# Patient Record
Sex: Male | Born: 1985 | Race: White | Hispanic: No | Marital: Married | Smoking: Never smoker
Health system: Southern US, Community
[De-identification: ages and names within clinical notes are randomized; demographics above are authoritative.]

## PROBLEM LIST (undated history)

## (undated) DIAGNOSIS — J45909 Unspecified asthma, uncomplicated: Secondary | ICD-10-CM

## (undated) HISTORY — PX: TONSILLECTOMY: SUR1361

## (undated) HISTORY — DX: Unspecified asthma, uncomplicated: J45.909

---
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
## 2011-01-30 ENCOUNTER — Emergency Department: Payer: Self-pay | Admitting: Emergency Medicine

## 2012-03-16 ENCOUNTER — Emergency Department: Payer: Self-pay | Admitting: Emergency Medicine

## 2013-01-10 ENCOUNTER — Emergency Department: Payer: Self-pay | Admitting: Emergency Medicine

## 2013-01-19 ENCOUNTER — Emergency Department: Payer: Self-pay | Admitting: Emergency Medicine

## 2014-01-03 IMAGING — CR DG WRIST COMPLETE 3+V*R*
1 series · 4 of 4 positions shown · non-contrast
Comparison: none

REASON FOR EXAM: wrist pain
COMMENTS:

[Series 1: x wrist pa right · 0.14mm/px · 4 of 4 slices shown]
[im 1/4]
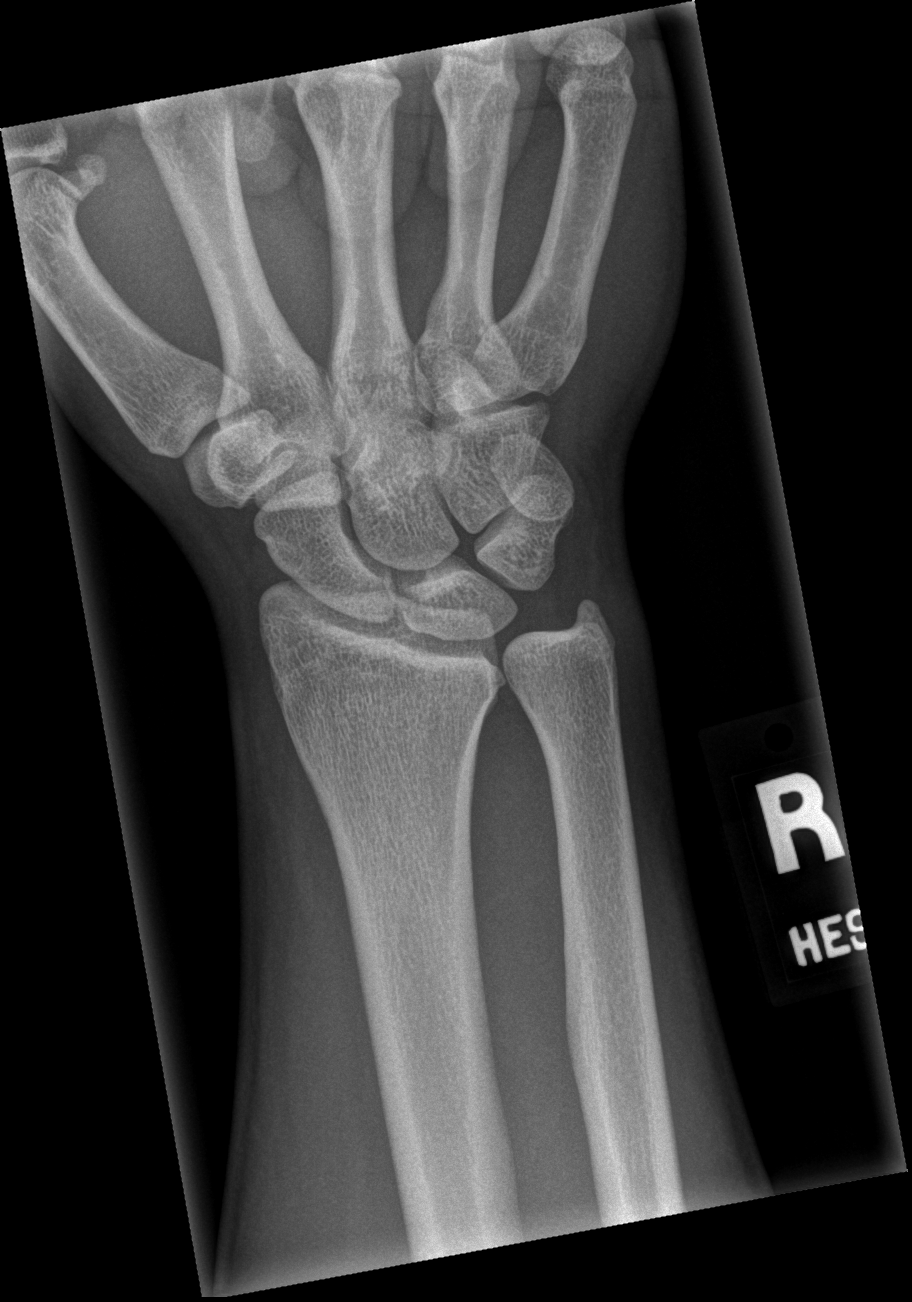
[im 2/4]
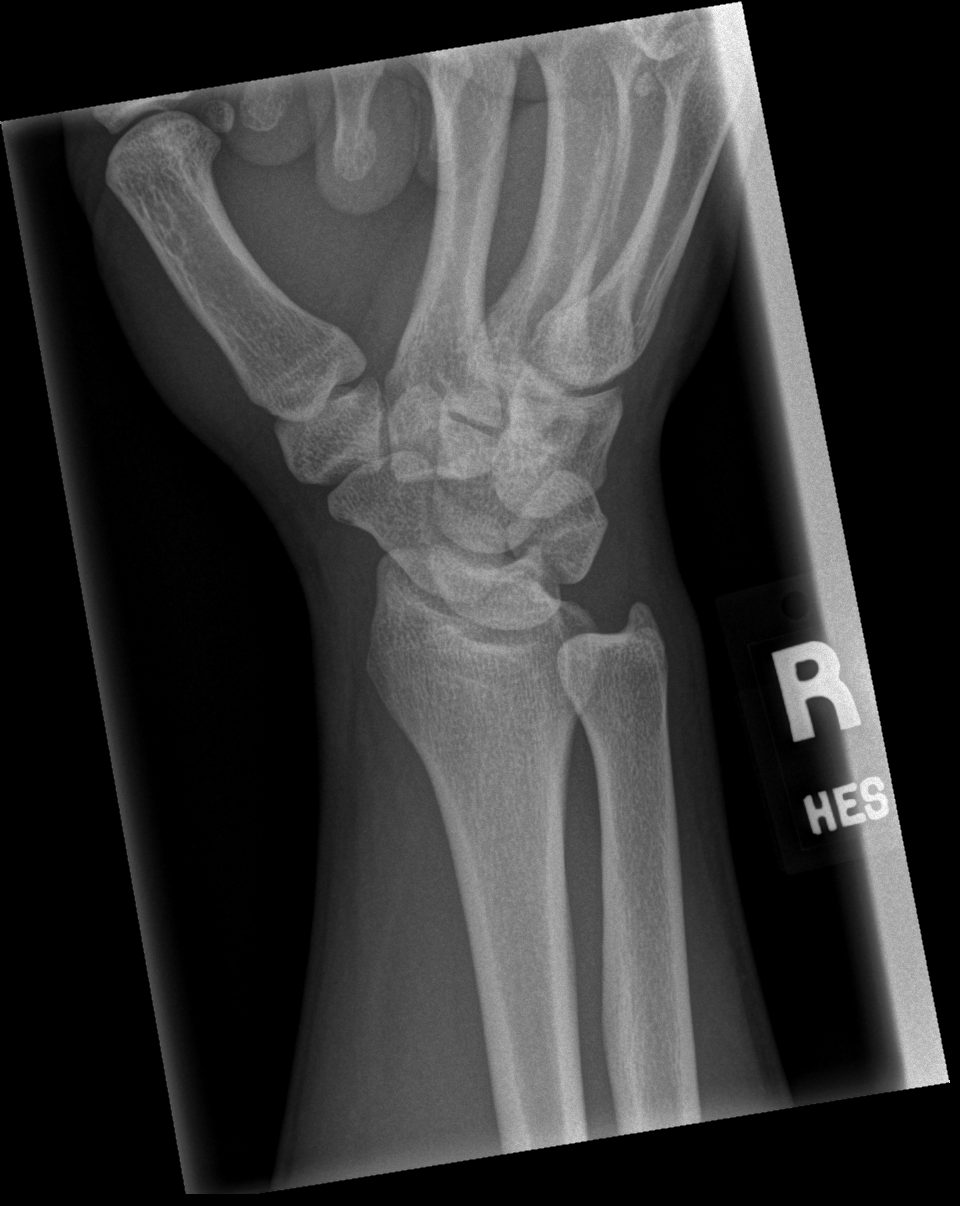
[im 3/4]
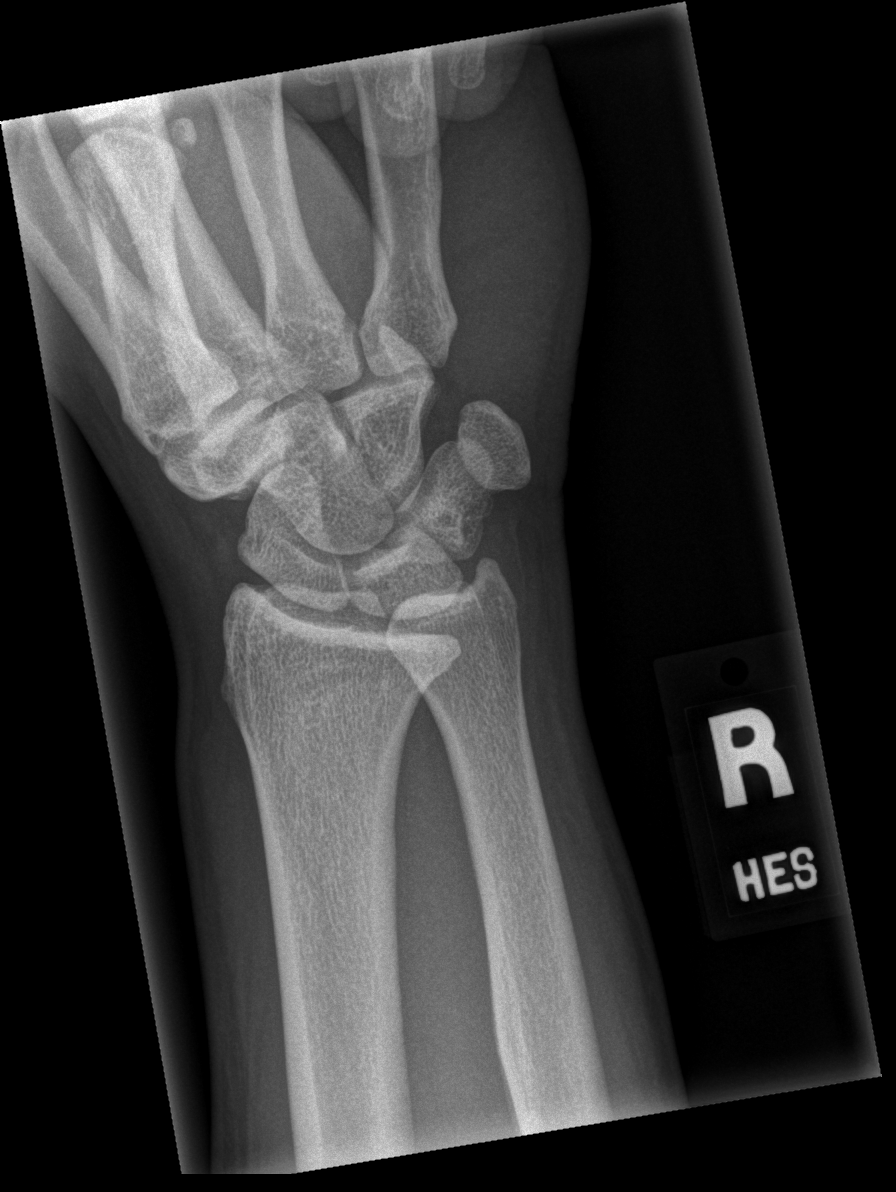
[im 4/4]
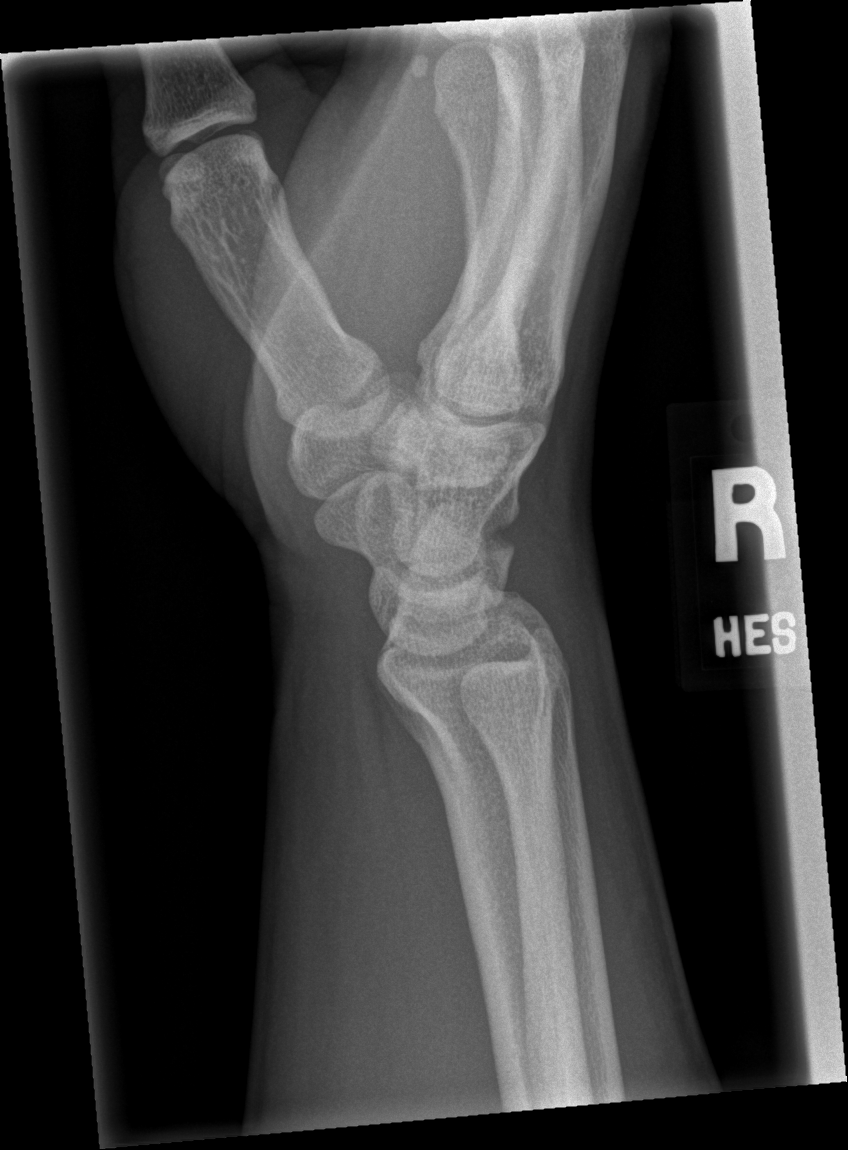

[4 of 4 positions shown; findings below may reference images not displayed]

PROCEDURE:     DXR - DXR WRIST RT COMP WITH OBLIQUES  - March 16, 2012  [DATE]

RESULT:     Four views of the right wrist reveal the bones to be adequately
mineralized. There is no evidence of acute fracture. The overlying soft
tissues exhibit no acute abnormality. No significant degenerative change is
demonstrated.
IMPRESSION: There is no acute bony abnormality of the right wrist.
Given the patient's symptoms, elective followup MRI may be useful.

[REDACTED]

## 2016-02-22 ENCOUNTER — Encounter: Payer: Self-pay | Admitting: Nurse Practitioner

## 2017-06-27 DIAGNOSIS — J309 Allergic rhinitis, unspecified: Secondary | ICD-10-CM | POA: Diagnosis not present

## 2017-06-27 DIAGNOSIS — J4540 Moderate persistent asthma, uncomplicated: Secondary | ICD-10-CM | POA: Diagnosis not present

## 2018-10-29 ENCOUNTER — Encounter: Payer: Self-pay | Admitting: Adult Health

## 2018-10-29 ENCOUNTER — Ambulatory Visit (INDEPENDENT_AMBULATORY_CARE_PROVIDER_SITE_OTHER): Payer: Commercial Managed Care - PPO | Admitting: Adult Health

## 2018-10-29 ENCOUNTER — Other Ambulatory Visit: Payer: Self-pay

## 2018-10-29 VITALS — BP 113/70 | HR 78 | Temp 98.3°F | Ht 65.75 in | Wt 192.3 lb

## 2018-10-29 DIAGNOSIS — Z114 Encounter for screening for human immunodeficiency virus [HIV]: Secondary | ICD-10-CM

## 2018-10-29 DIAGNOSIS — J4540 Moderate persistent asthma, uncomplicated: Secondary | ICD-10-CM | POA: Diagnosis not present

## 2018-10-29 DIAGNOSIS — Z0000 Encounter for general adult medical examination without abnormal findings: Secondary | ICD-10-CM | POA: Diagnosis not present

## 2018-10-29 MED ORDER — FLUTICASONE PROPIONATE 50 MCG/ACT NA SUSP: 2.0000 | Freq: Every day | NASAL | 2 refills | Status: DC

## 2018-10-29 MED ORDER — ALBUTEROL SULFATE HFA 108 (90 BASE) MCG/ACT IN AERS: 6.0000 | INHALATION_SPRAY | Freq: Four times a day (QID) | RESPIRATORY_TRACT | 6 refills | Status: DC | PRN

## 2018-10-29 MED ORDER — ADVAIR DISKUS 500-50 MCG/DOSE IN AEPB: 1.0000 | INHALATION_SPRAY | Freq: Every day | RESPIRATORY_TRACT | 6 refills | Status: DC | PRN

## 2018-10-29 NOTE — Assessment & Plan Note (Signed)
Last PFT in early childhood Has never been evaluated by Pulmonologist  Reports sx's well controlled- currently on Advair 500-50 mcg QD, Ventolin HFA PRN He has never used tobacco/vape products

## 2018-10-29 NOTE — Patient Instructions (Signed)
Mediterranean Diet A Mediterranean diet refers to food and lifestyle choices that are based on the traditions of countries located on the [employer]. This way of eating has been shown to help prevent certain conditions and improve outcomes for people who have chronic diseases, like kidney disease and heart disease. What are tips for following this plan? Lifestyle  Cook and eat meals together with your family, when possible.  Drink enough fluid to keep your urine clear or pale yellow.  Be physically active every day. This includes: ? Aerobic exercise like running or swimming. ? Leisure activities like gardening, walking, or housework.  Get 7-8 hours of sleep each night.  If recommended by your health care provider, drink red wine in moderation. This means 1 glass a day for nonpregnant women and 2 glasses a day for men. A glass of wine equals 5 oz (150 mL). Reading food labels   Check the serving size of packaged foods. For foods such as rice and pasta, the serving size refers to the amount of cooked product, not dry.  Check the total fat in packaged foods. Avoid foods that have saturated fat or trans fats.  Check the ingredients list for added sugars, such as corn syrup. Shopping  At the grocery store, buy most of your food from the areas near the walls of the store. This includes: ? Fresh fruits and vegetables (produce). ? Grains, beans, nuts, and seeds. Some of these may be available in unpackaged forms or large amounts (in bulk). ? Fresh seafood. ? Poultry and eggs. ? Low-fat dairy products.  Buy whole ingredients instead of prepackaged foods.  Buy fresh fruits and vegetables in-season from local farmers markets.  Buy frozen fruits and vegetables in resealable bags.  If you do not have access to quality fresh seafood, buy precooked frozen shrimp or canned fish, such as tuna, salmon, or sardines.  Buy small amounts of raw or cooked vegetables, salads, or olives from  the deli or salad bar at your store.  Stock your pantry so you always have certain foods on hand, such as olive oil, canned tuna, canned tomatoes, rice, pasta, and beans. Cooking  Cook foods with extra-virgin olive oil instead of using butter or other vegetable oils.  Have meat as a side dish, and have vegetables or grains as your main dish. This means having meat in small portions or adding small amounts of meat to foods like pasta or stew.  Use beans or vegetables instead of meat in common dishes like chili or lasagna.  Experiment with different cooking methods. Try roasting or broiling vegetables instead of steaming or sauteing them.  Add frozen vegetables to soups, stews, pasta, or rice.  Add nuts or seeds for added healthy fat at each meal. You can add these to yogurt, salads, or vegetable dishes.  Marinate fish or vegetables using olive oil, lemon juice, garlic, and fresh herbs. Meal planning   Plan to eat 1 vegetarian meal one day each week. Try to work up to 2 vegetarian meals, if possible.  Eat seafood 2 or more times a week.  Have healthy snacks readily available, such as: ? Vegetable sticks with hummus. ? [place] yogurt. ? Fruit and nut trail mix.  Eat balanced meals throughout the week. This includes: ? Fruit: 2-3 servings a day ? Vegetables: 4-5 servings a day ? Low-fat dairy: 2 servings a day ? Fish, poultry, or lean meat: 1 serving a day ? Beans and legumes: 2 or more servings a week ?  Nuts and seeds: 1-2 servings a day ? Whole grains: 6-8 servings a day ? Extra-virgin olive oil: 3-4 servings a day  Limit red meat and sweets to only a few servings a month What are my food choices?  Mediterranean diet ? Recommended ? Grains: Whole-grain pasta. Brown rice. Bulgar wheat. Polenta. Couscous. Whole-wheat bread. Orpah Cobb. ? Vegetables: Artichokes. Beets. Broccoli. Cabbage. Carrots. Eggplant. Green beans. Chard. Kale. Spinach. Onions. Leeks. Peas. Squash.  Tomatoes. Peppers. Radishes. ? Fruits: Apples. Apricots. Avocado. Berries. Bananas. Cherries. Dates. Figs. Grapes. Lemons. Melon. Oranges. Peaches. Plums. Pomegranate. ? Meats and other protein foods: Beans. Almonds. Sunflower seeds. Pine nuts. Peanuts. Cod. Salmon. Scallops. Shrimp. Tuna. Tilapia. Clams. Oysters. Eggs. ? Dairy: Low-fat milk. Cheese. Greek yogurt. ? Beverages: Water. Red wine. Herbal tea. ? Fats and oils: Extra virgin olive oil. Avocado oil. Grape seed oil. ? Sweets and desserts: [place] yogurt with honey. Baked apples. Poached pears. Trail mix. ? Seasoning and other foods: Basil. Cilantro. Coriander. Cumin. Mint. Parsley. Sage. Rosemary. Tarragon. Garlic. Oregano. Thyme. Pepper. Balsalmic vinegar. Tahini. Hummus. Tomato sauce. Olives. Mushrooms. ? Limit these ? Grains: Prepackaged pasta or rice dishes. Prepackaged cereal with added sugar. ? Vegetables: Deep fried potatoes (french fries). ? Fruits: Fruit canned in syrup. ? Meats and other protein foods: Beef. Pork. Lamb. Poultry with skin. Hot dogs. Tomasa Blase. ? Dairy: Ice cream. Sour cream. Whole milk. ? Beverages: Juice. Sugar-sweetened soft drinks. Beer. Liquor and spirits. ? Fats and oils: Butter. Canola oil. Vegetable oil. Beef fat (tallow). Lard. ? Sweets and desserts: Cookies. Cakes. Pies. Candy. ? Seasoning and other foods: Mayonnaise. Premade sauces and marinades. ? The items listed may not be a complete list. Talk with your dietitian about what dietary choices are right for you. Summary  The Mediterranean diet includes both food and lifestyle choices.  Eat a variety of fresh fruits and vegetables, beans, nuts, seeds, and whole grains.  Limit the amount of red meat and sweets that you eat.  Talk with your health care provider about whether it is safe for you to drink red wine in moderation. This means 1 glass a day for nonpregnant women and 2 glasses a day for men. A glass of wine equals 5 oz (150 mL). This information  is not intended to replace advice given to you by your health care provider. Make sure you discuss any questions you have with your health care provider. Document Released: 02/17/2016 Document Revised: 03/21/2016 Document Reviewed: 02/17/2016 Elsevier Interactive Patient Education  2019 [employer].   [place] job with your daily water intake! Follow Mediterranean diet. Recommend using Google to search Glycemix Index of Fruits and Vegetables. Reduce overall carbohydrate/sugar intake- will help with weight loss. Increase regular exercise.  Recommend at least 30 minutes daily, 5 days per week of walking, jogging, biking, swimming, YouTube/Pinterest workout videos. Please schedule fasting lab appt in 2 weeks, complete physical in 3 months. WELCOME TO THE PRACTICE!

## 2018-10-29 NOTE — Assessment & Plan Note (Signed)
Great job with your daily water intake! Follow Mediterranean diet. Recommend using Google to search Glycemix Index of Fruits and Vegetables. Reduce overall carbohydrate/sugar intake- will help with weight loss. Increase regular exercise.  Recommend at least 30 minutes daily, 5 days per week of walking, jogging, biking, swimming, YouTube/Pinterest workout videos. Please schedule fasting lab appt in 2 weeks, complete physical in 3 months.

## 2018-10-29 NOTE — Progress Notes (Signed)
Subjective:    Patient ID: Scott Marquez, male    DOB: 30-Nov-1985, 33 y.o.   MRN: [id number]  HPI:  Mr. Perfecto is here to establish as a new pt. He is a pleasant 33 year old male. PMH: Moderate Persistent Asthma- dx'd at "about age [age]" He believes his one and only PFT testing was performed in early childhood He has never been evaluated by a Pulmonologist He reports sx's are very well controlled on once daily Susa Simmonds He denies tobacco/vape/ETOH He reports working 60-80hrs week and will walk 7-8 miles day when working He regular exercise outside of work He excellent, restful sleep, 6-7 hrs/night He reports his father had sudden MI Dec 2019 He denies acute cardiac sx's He reports wanting to loss about 10 lbs, current wt 192  Patient Care Team    Relationship Specialty Notifications Start End  Julaine Fusi, NP PCP - General Family Medicine  10/29/18   [facility]  Internal Medicine  10/29/18 10/29/18    Patient Active Problem List   Diagnosis Date Noted  . Healthcare maintenance 10/29/2018  . Moderate persistent asthma 10/29/2018     Past Medical History:  Diagnosis Date  . Asthma      Past Surgical History:  Procedure Laterality Date  . TONSILLECTOMY       Family History  Problem Relation Age of Onset  . Heart attack Father      Social History   Substance and Sexual Activity  Drug Use Never     Social History   Substance and Sexual Activity  Alcohol Use Never  . Frequency: Never     Social History   Tobacco Use  Smoking Status Never Smoker  Smokeless Tobacco Never Used     Outpatient Encounter Medications as of 10/29/2018  Medication Sig  . ADVAIR DISKUS 500-50 MCG/DOSE AEPB Inhale 1 puff into the lungs daily as needed.  Marland Kitchen albuterol (VENTOLIN HFA) 108 (90 Base) MCG/ACT inhaler Inhale 6 puffs into the lungs every 6 (six) hours as needed for wheezing or shortness of breath.  . [DISCONTINUED] ADVAIR DISKUS 500-50 MCG/DOSE AEPB  Inhale 1 puff into the lungs daily as needed.  . [DISCONTINUED] albuterol (VENTOLIN HFA) 108 (90 Base) MCG/ACT inhaler Inhale 2 puffs into the lungs every 6 (six) hours as needed for wheezing or shortness of breath.  . fluticasone (FLONASE) 50 MCG/ACT nasal spray Place 2 sprays into both nostrils daily.   No facility-administered encounter medications on file as of 10/29/2018.     Allergies: Patient has no allergy information on record.  Body mass index is 31.27 kg/m.  Blood pressure 113/70, pulse 78, temperature 98.3 F (36.8 C), temperature source Oral, height 5' 5.75" (1.67 m), weight 192 lb 4.8 oz (87.2 kg), SpO2 97 %.  Review of Systems  Constitutional: Positive for fatigue. Negative for activity change, appetite change, chills, diaphoresis, fever and unexpected weight change.  HENT: Negative for congestion.   Eyes: Negative for visual disturbance.  Respiratory: Negative for cough, chest tightness, shortness of breath, wheezing and stridor.   Cardiovascular: Negative for chest pain, palpitations and leg swelling.  Gastrointestinal: Negative for abdominal distention, abdominal pain, anal bleeding, blood in stool, constipation, diarrhea, nausea and vomiting.  Endocrine: Negative for cold intolerance, heat intolerance, polydipsia, polyphagia and polyuria.  Genitourinary: Negative for difficulty urinating and flank pain.  Musculoskeletal: Negative for arthralgias, back pain, gait problem, joint swelling, myalgias, neck pain and neck stiffness.  Skin: Negative for color change, pallor, rash and  wound.  Neurological: Negative for dizziness and headaches.  Hematological: Does not bruise/bleed easily.  Psychiatric/Behavioral: Negative for agitation, behavioral problems, confusion, decreased concentration, dysphoric mood, hallucinations, self-injury, sleep disturbance and suicidal ideas. The patient is not nervous/anxious and is not hyperactive.        Objective:   Physical Exam Vitals  signs and nursing note reviewed.  Constitutional:      General: He is not in acute distress.    Appearance: Normal appearance. He is not ill-appearing, toxic-appearing or diaphoretic.  HENT:     Head: Normocephalic and atraumatic.     Nose: Nose normal.  Eyes:     Extraocular Movements: Extraocular movements intact.     Conjunctiva/sclera: Conjunctivae normal.     Pupils: Pupils are equal, round, and reactive to light.  Cardiovascular:     Rate and Rhythm: Normal rate.     Pulses: Normal pulses.     Heart sounds: Normal heart sounds. No murmur. No friction rub. No gallop.   Pulmonary:     Effort: Pulmonary effort is normal. No respiratory distress.     Breath sounds: Normal breath sounds. No stridor. No wheezing, rhonchi or rales.  Chest:     Chest wall: No tenderness.  Skin:    Capillary Refill: Capillary refill takes less than 2 seconds.  Neurological:     Mental Status: He is alert and oriented to person, place, and time.  Psychiatric:        Mood and Affect: Mood normal.        Behavior: Behavior normal.        Thought Content: Thought content normal.        Judgment: Judgment normal.       Assessment & Plan:   1. Screening for HIV (human immunodeficiency virus)   2. Healthcare maintenance   3. Moderate persistent asthma, unspecified whether complicated     Healthcare maintenance Great job with your daily water intake! Follow Mediterranean diet. Recommend using Google to search Glycemix Index of Fruits and Vegetables. Reduce overall carbohydrate/sugar intake- will help with weight loss. Increase regular exercise.  Recommend at least 30 minutes daily, 5 days per week of walking, jogging, biking, swimming, YouTube/Pinterest workout videos. Please schedule fasting lab appt in 2 weeks, complete physical in 3 months.  Moderate persistent asthma Last PFT in early childhood Has never been evaluated by Pulmonologist  Reports sx's well controlled- currently on Advair  500-50 mcg QD, Ventolin HFA PRN He has never used tobacco/vape products      FOLLOW-UP:  Return in about 3 months (around 01/28/2019) for CPE.

## 2018-11-12 ENCOUNTER — Other Ambulatory Visit (INDEPENDENT_AMBULATORY_CARE_PROVIDER_SITE_OTHER): Payer: Commercial Managed Care - PPO

## 2018-11-12 ENCOUNTER — Other Ambulatory Visit: Payer: Self-pay

## 2018-11-12 DIAGNOSIS — Z0000 Encounter for general adult medical examination without abnormal findings: Secondary | ICD-10-CM

## 2018-11-12 DIAGNOSIS — Z114 Encounter for screening for human immunodeficiency virus [HIV]: Secondary | ICD-10-CM

## 2018-11-13 LAB — COMPREHENSIVE METABOLIC PANEL WITH GFR
ALT: 17 IU/L (ref 0–44)
AST: 24 IU/L (ref 0–40)
Albumin/Globulin Ratio: 1.8 (ref 1.2–2.2)
Albumin: 4.2 g/dL (ref 4.0–5.0)
Alkaline Phosphatase: 57 IU/L (ref 39–117)
BUN/Creatinine Ratio: 14 (ref 9–20)
BUN: 13 mg/dL (ref 6–20)
Bilirubin Total: 0.5 mg/dL (ref 0.0–1.2)
CO2: 25 mmol/L (ref 20–29)
Calcium: 9.4 mg/dL (ref 8.7–10.2)
Chloride: 103 mmol/L (ref 96–106)
Creatinine, Ser: 0.92 mg/dL (ref 0.76–1.27)
GFR calc Af Amer: 127 mL/min/1.73
GFR calc non Af Amer: 110 mL/min/1.73
Globulin, Total: 2.4 g/dL (ref 1.5–4.5)
Glucose: 84 mg/dL (ref 65–99)
Potassium: 4.8 mmol/L (ref 3.5–5.2)
Sodium: 141 mmol/L (ref 134–144)
Total Protein: 6.6 g/dL (ref 6.0–8.5)

## 2018-11-13 LAB — CBC WITH DIFFERENTIAL/PLATELET
Basophils Absolute: 0 x10E3/uL (ref 0.0–0.2)
Basos: 0 %
EOS (ABSOLUTE): 0.2 x10E3/uL (ref 0.0–0.4)
Eos: 3 %
Hematocrit: 45.5 % (ref 37.5–51.0)
Hemoglobin: 15.9 g/dL (ref 13.0–17.7)
Immature Grans (Abs): 0 x10E3/uL (ref 0.0–0.1)
Immature Granulocytes: 0 %
Lymphocytes Absolute: 2.2 x10E3/uL (ref 0.7–3.1)
Lymphs: 35 %
MCH: 32.1 pg (ref 26.6–33.0)
MCHC: 34.9 g/dL (ref 31.5–35.7)
MCV: 92 fL (ref 79–97)
Monocytes Absolute: 0.5 x10E3/uL (ref 0.1–0.9)
Monocytes: 8 %
Neutrophils Absolute: 3.5 x10E3/uL (ref 1.4–7.0)
Neutrophils: 54 %
Platelets: 313 x10E3/uL (ref 150–450)
RBC: 4.95 x10E6/uL (ref 4.14–5.80)
RDW: 13.1 % (ref 11.6–15.4)
WBC: 6.5 x10E3/uL (ref 3.4–10.8)

## 2018-11-13 LAB — HEMOGLOBIN A1C
Est. average glucose Bld gHb Est-mCnc: 97 mg/dL
Hgb A1c MFr Bld: 5 % (ref 4.8–5.6)

## 2018-11-13 LAB — LIPID PANEL
Chol/HDL Ratio: 3.9 ratio (ref 0.0–5.0)
Cholesterol, Total: 196 mg/dL (ref 100–199)
HDL: 50 mg/dL (ref >39)
LDL Calculated: 133 mg/dL — ABNORMAL HIGH (ref 0–99)
Triglycerides: 66 mg/dL (ref 0–149)
VLDL Cholesterol Cal: 13 mg/dL (ref 5–40)

## 2018-11-13 LAB — HIV ANTIBODY (ROUTINE TESTING W REFLEX): HIV Screen 4th Generation wRfx: NONREACTIVE

## 2018-11-13 LAB — TSH: TSH: 1.85 u[IU]/mL (ref 0.450–4.500)

## 2019-01-13 NOTE — Progress Notes (Deleted)
   Subjective:    Patient ID: Scott Marquez, male    DOB: 08-Sep-1985, 33 y.o.   MRN: [id number]  HPI:  Mr. Hartis is here for CPE  11/12/2018 Labs- HIV-negative  TSH-WNL 1.850  A1c-WNL, 5.0  CMP-WNL  CBC-WNL  Lipid Panel:  Overall levels are good with elevation in LDL-133, normal 99- please encourage reduced saturated fat intake and increase in regular exercise.   Healthcare Maintenance: Immunizations-   Patient Care Team    Relationship Specialty Notifications Start End  [place], Berna Spare, NP PCP - General Family Medicine  10/29/18     Patient Active Problem List   Diagnosis Date Noted  . Healthcare maintenance 10/29/2018  . Moderate persistent asthma 10/29/2018     Past Medical History:  Diagnosis Date  . Asthma      Past Surgical History:  Procedure Laterality Date  . TONSILLECTOMY       Family History  Problem Relation Age of Onset  . Heart attack Father      Social History   Substance and Sexual Activity  Drug Use Never     Social History   Substance and Sexual Activity  Alcohol Use Never  . Frequency: Never     Social History   Tobacco Use  Smoking Status Never Smoker  Smokeless Tobacco Never Used     Outpatient Encounter Medications as of 01/15/2019  Medication Sig  . ADVAIR DISKUS 500-50 MCG/DOSE AEPB Inhale 1 puff into the lungs daily as needed.  Marland Kitchen albuterol (VENTOLIN HFA) 108 (90 Base) MCG/ACT inhaler Inhale 6 puffs into the lungs every 6 (six) hours as needed for wheezing or shortness of breath.  . fluticasone (FLONASE) 50 MCG/ACT nasal spray Place 2 sprays into both nostrils daily.   No facility-administered encounter medications on file as of 01/15/2019.     Allergies: Patient has no allergy information on record.  There is no height or weight on file to calculate BMI.  There were no vitals taken for this visit.     Review of Systems     Objective:   Physical Exam        Assessment & Plan:  No diagnosis  found.  No problem-specific Assessment & Plan notes found for this encounter.    FOLLOW-UP:  No follow-ups on file.

## 2019-01-15 ENCOUNTER — Encounter: Payer: Commercial Managed Care - PPO | Admitting: Adult Health

## 2019-01-28 ENCOUNTER — Encounter: Payer: Commercial Managed Care - PPO | Admitting: Family Medicine

## 2019-09-05 ENCOUNTER — Other Ambulatory Visit: Payer: Self-pay | Admitting: Adult Health

## 2019-10-13 ENCOUNTER — Telehealth: Payer: Self-pay | Admitting: Adult Health

## 2019-10-13 ENCOUNTER — Telehealth: Payer: Commercial Managed Care - PPO | Admitting: Family

## 2019-10-13 DIAGNOSIS — J4540 Moderate persistent asthma, uncomplicated: Secondary | ICD-10-CM | POA: Diagnosis not present

## 2019-10-13 MED ORDER — ADVAIR DISKUS 500-50 MCG/DOSE IN AEPB: 1.0000 | INHALATION_SPRAY | Freq: Every day | RESPIRATORY_TRACT | 0 refills | Status: DC | PRN

## 2019-10-13 NOTE — Progress Notes (Signed)
E-Visits are not used to request refills.  After reviewing your records, I can verify that you may be running out of a long term medication before your next scheduled appointment.  Based on this information,  I can refill your Advair Diskus on a one time basis.  Please contact your doctor as soon as possible to manage your prescription.  Approximately 5 minutes was spent documenting and reviewing patient's chart.

## 2019-10-13 NOTE — Telephone Encounter (Signed)
Pt's wife called states she knows they haven't been to office since 4/21/ 2020 but patient needs his  Rx for :   ADVAIR DISKUS 500-50 MCG/DOSE AEPB [[id number]]   Order Details Dose: 1 puff Route: Inhalation Frequency: Daily PRN  Dispense Quantity: 60 each Refills: 6   Indications of Use: Asthma      Sig: Inhale 1 puff into the lungs daily as needed.   -------Forwarding request to med asst that if approved send refill order to :   [facility] - [place], [place] - [address] [phone] (Phone) [phone] (Fax)   --glh

## 2019-10-13 NOTE — Telephone Encounter (Signed)
Please call pt to schedule appt.  ABSOLUTELY No further refills until pt is seen.  Tiajuana Amass, CMA

## 2020-04-20 ENCOUNTER — Other Ambulatory Visit: Payer: Self-pay | Admitting: Nurse Practitioner

## 2020-04-20 DIAGNOSIS — U071 COVID-19: Secondary | ICD-10-CM

## 2020-04-20 DIAGNOSIS — J4540 Moderate persistent asthma, uncomplicated: Secondary | ICD-10-CM

## 2020-04-20 NOTE — Progress Notes (Signed)
I connected by phone with Scott Marquez on 04/20/2020 at 8:27 PM to discuss the potential use of a new treatment for mild to moderate COVID-19 viral infection in non-hospitalized patients.  This patient is a 34 y.o. male that meets the FDA criteria for Emergency Use Authorization of COVID monoclonal antibody casirivimab/imdevimab or bamlanivimab/eteseviamb.  Has a (+) direct SARS-CoV-2 viral test result  Has mild or moderate COVID-19   Is NOT hospitalized due to COVID-19  Is within 10 days of symptom onset  Has at least one of the high risk factor(s) for progression to severe COVID-19 and/or hospitalization as defined in EUA.  Specific high risk criteria : BMI > 25   I have spoken and communicated the following to the patient or parent/caregiver regarding COVID monoclonal antibody treatment:  1. FDA has authorized the emergency use for the treatment of mild to moderate COVID-19 in adults and pediatric patients with positive results of direct SARS-CoV-2 viral testing who are 5 years of age and older weighing at least 40 kg, and who are at high risk for progressing to severe COVID-19 and/or hospitalization.  2. The significant known and potential risks and benefits of COVID monoclonal antibody, and the extent to which such potential risks and benefits are unknown.  3. Information on available alternative treatments and the risks and benefits of those alternatives, including clinical trials.  4. Patients treated with COVID monoclonal antibody should continue to self-isolate and use infection control measures (e.g., wear mask, isolate, social distance, avoid sharing personal items, clean and disinfect "high touch" surfaces, and frequent handwashing) according to CDC guidelines.   5. The patient or parent/caregiver has the option to accept or refuse COVID monoclonal antibody treatment.  After reviewing this information with the patient, the patient has agreed to receive one of the  available covid 19 monoclonal antibodies and will be provided an appropriate fact sheet prior to infusion. Mayra Reel, NP 04/20/2020 8:27 PM

## 2020-04-21 ENCOUNTER — Ambulatory Visit (HOSPITAL_COMMUNITY)
Admission: RE | Admit: 2020-04-21 | Discharge: 2020-04-21 | Disposition: A | Discharge status: Home / Self Care | Payer: Commercial Managed Care - PPO | Source: Ambulatory Visit | Attending: Pulmonary Disease | Admitting: Pulmonary Disease

## 2020-04-21 ENCOUNTER — Other Ambulatory Visit (HOSPITAL_COMMUNITY): Payer: Self-pay

## 2020-04-21 DIAGNOSIS — U071 COVID-19: Secondary | ICD-10-CM | POA: Diagnosis present

## 2020-04-21 DIAGNOSIS — J4540 Moderate persistent asthma, uncomplicated: Secondary | ICD-10-CM | POA: Diagnosis present

## 2020-04-21 MED ORDER — ALBUTEROL SULFATE HFA 108 (90 BASE) MCG/ACT IN AERS: 2.0000 | INHALATION_SPRAY | Freq: Once | RESPIRATORY_TRACT | Status: DC | PRN

## 2020-04-21 MED ORDER — METHYLPREDNISOLONE SODIUM SUCC 125 MG IJ SOLR: 125.0000 mg | Freq: Once | INTRAMUSCULAR | Status: DC | PRN

## 2020-04-21 MED ORDER — SODIUM CHLORIDE 0.9 % IV SOLN: Freq: Once | INTRAVENOUS | Status: AC

## 2020-04-21 MED ORDER — SODIUM CHLORIDE 0.9 % IV SOLN: INTRAVENOUS | Status: DC | PRN

## 2020-04-21 MED ORDER — EPINEPHRINE 0.3 MG/0.3ML IJ SOAJ: 0.3000 mg | Freq: Once | INTRAMUSCULAR | Status: DC | PRN

## 2020-04-21 MED ORDER — FAMOTIDINE IN NACL 20-0.9 MG/50ML-% IV SOLN: 20.0000 mg | Freq: Once | INTRAVENOUS | Status: DC | PRN

## 2020-04-21 MED ORDER — DIPHENHYDRAMINE HCL 50 MG/ML IJ SOLN: 50.0000 mg | Freq: Once | INTRAMUSCULAR | Status: DC | PRN

## 2020-04-21 NOTE — Discharge Instructions (Signed)

## 2020-04-21 NOTE — Progress Notes (Signed)
  Diagnosis: COVID-19  Physician:patrick wright   Procedure: [facility] Med: bamlanivimab\etesevimab infusion - Provided patient with bamlanimivab\etesevimab fact sheet for patients, parents and caregivers prior to infusion.  Complications: No immediate complications noted.  Discharge: Discharged home   Shaune Spittle 04/21/2020

## 2020-06-04 ENCOUNTER — Other Ambulatory Visit: Payer: Self-pay

## 2020-06-04 ENCOUNTER — Encounter (HOSPITAL_COMMUNITY): Payer: Self-pay | Admitting: Emergency Medicine

## 2020-06-04 ENCOUNTER — Emergency Department (HOSPITAL_COMMUNITY)
Admission: EM | Admit: 2020-06-04 | Discharge: 2020-06-04 | Disposition: A | Discharge status: Home / Self Care | Payer: Commercial Managed Care - PPO | Attending: Emergency Medicine | Admitting: Emergency Medicine

## 2020-06-04 DIAGNOSIS — J45909 Unspecified asthma, uncomplicated: Secondary | ICD-10-CM | POA: Diagnosis not present

## 2020-06-04 DIAGNOSIS — Z79891 Long term (current) use of opiate analgesic: Secondary | ICD-10-CM | POA: Diagnosis not present

## 2020-06-04 DIAGNOSIS — M545 Low back pain, unspecified: Secondary | ICD-10-CM | POA: Diagnosis not present

## 2020-06-04 MED ORDER — KETOROLAC TROMETHAMINE 30 MG/ML IJ SOLN
30.0000 mg | Freq: Once | INTRAMUSCULAR | Status: AC
  Administered 2020-06-04: 30 mg via INTRAMUSCULAR
  Filled 2020-06-04: qty 1

## 2020-06-04 MED ORDER — NAPROXEN 500 MG PO TABS: 500.0000 mg | ORAL_TABLET | Freq: Two times a day (BID) | ORAL | 0 refills | Status: DC

## 2020-06-04 MED ORDER — METHOCARBAMOL 500 MG PO TABS: 500.0000 mg | ORAL_TABLET | Freq: Two times a day (BID) | ORAL | 0 refills | Status: DC

## 2020-06-04 MED ORDER — LIDOCAINE 5 % EX PTCH
1.0000 | MEDICATED_PATCH | CUTANEOUS | Status: DC
  Administered 2020-06-04: 1 via TRANSDERMAL
  Filled 2020-06-04: qty 1

## 2020-06-04 NOTE — ED Notes (Signed)
Patient verbalizes understanding of discharge instructions. Opportunity for questioning and answers were provided. Pt discharged from ED. 

## 2020-06-04 NOTE — ED Provider Notes (Signed)
[facility] EMERGENCY DEPARTMENT Provider Note   CSN: [id number] Arrival date & time: 06/04/20  4765     History Chief Complaint  Patient presents with  . Back Pain    Scott Marquez is a 34 y.o. male with a past medical history significant for asthma who presents to the ED due to persistent left-sided low back pain x1 month.  Pain is nonradiating in nature.  Patient states pain is worse when standing for long periods of time. Patient is a [employer] investment [employer] and notes worsening pain typically after long days. Denies direct injury to low back.  Denies lower extremity weakness, lower extremity numbness/tingling, saddle paresthesias, bowel/bladder incontinence, history of cancer, fever, chills, and IV drug use.  No treatment prior to arrival.  Denies abdominal pain and urinary symptoms.  History obtained from patient and past medical records. No interpreter used during encounter.      Past Medical History:  Diagnosis Date  . Asthma     Patient Active Problem List   Diagnosis Date Noted  . Healthcare maintenance 10/29/2018  . Moderate persistent asthma 10/29/2018    Past Surgical History:  Procedure Laterality Date  . TONSILLECTOMY         Family History  Problem Relation Age of Onset  . Heart attack Father     Social History   Tobacco Use  . Smoking status: Never Smoker  . Smokeless tobacco: Never Used  Vaping Use  . Vaping Use: Never used  Substance Use Topics  . Alcohol use: Never  . Drug use: Never    Home Medications Prior to Admission medications   Medication Sig Start Date End Date Taking? Authorizing Provider  ADVAIR DISKUS 500-50 MCG/DOSE AEPB Inhale 1 puff into the lungs daily as needed. 10/13/19   Junie Spencer, FNP  albuterol (VENTOLIN HFA) 108 (90 Base) MCG/ACT inhaler Inhale 6 puffs into the lungs every 6 (six) hours as needed for wheezing or shortness of breath. 10/29/18   Danford, Orpha Bur D, NP  fluticasone (FLONASE) 50  MCG/ACT nasal spray Place 2 sprays into both nostrils daily. 10/29/18   Danford, Orpha Bur D, NP  methocarbamol (ROBAXIN) 500 MG tablet Take 1 tablet (500 mg total) by mouth 2 (two) times daily. 06/04/20   Mannie Stabile, PA-C  naproxen (NAPROSYN) 500 MG tablet Take 1 tablet (500 mg total) by mouth 2 (two) times daily. 06/04/20   Mannie Stabile, PA-C    Allergies    Patient has no known allergies.  Review of Systems   Review of Systems  Constitutional: Negative for chills and fever.  Gastrointestinal: Negative for abdominal pain.  Genitourinary: Negative for dysuria.  Musculoskeletal: Positive for back pain. Negative for gait problem.  All other systems reviewed and are negative.   Physical Exam Updated Vital Signs BP 127/75 (BP Location: Left Arm)   Pulse 75   Temp 98 F (36.7 C) (Oral)   Resp 18   SpO2 99%   Physical Exam Vitals and nursing note reviewed.  Constitutional:      General: He is not in acute distress.    Appearance: He is not ill-appearing.     Comments: Well-appearing 34 year old male.  HENT:     Head: Normocephalic.  Eyes:     Pupils: Pupils are equal, round, and reactive to light.  Cardiovascular:     Rate and Rhythm: Normal rate and regular rhythm.     Pulses: Normal pulses.     Heart sounds: Normal heart sounds.  No murmur heard.  No friction rub. No gallop.   Pulmonary:     Effort: Pulmonary effort is normal.     Breath sounds: Normal breath sounds.  Abdominal:     General: Abdomen is flat. There is no distension.     Palpations: Abdomen is soft.     Tenderness: There is no abdominal tenderness. There is no guarding or rebound.  Musculoskeletal:     Cervical back: Neck supple.     Comments: No T-spine and L-spine midline tenderness, no stepoff or deformity, reproducible left lumbar paraspinal tenderness No leg edema bilaterally Patient moves all extremities without difficulty. DP/PT pulses 2+ and equal bilaterally Sensation grossly intact  bilaterally Able to ambulate without difficulty  Skin:    General: Skin is warm and dry.  Neurological:     General: No focal deficit present.     Mental Status: He is alert.  Psychiatric:        Mood and Affect: Mood normal.        Behavior: Behavior normal.     ED Results / Procedures / Treatments   Labs (all labs ordered are listed, but only abnormal results are displayed) Labs Reviewed - No data to display  EKG None  Radiology No results found.  Procedures Procedures (including critical care time)  Medications Ordered in ED Medications  lidocaine (LIDODERM) 5 % 1 patch (has no administration in time range)  ketorolac (TORADOL) 30 MG/ML injection 30 mg (30 mg Intramuscular Given 06/04/20 0935)    ED Course  I have reviewed the triage vital signs and the nursing notes.  Pertinent labs & imaging results that were available during my care of the patient were reviewed by me and considered in my medical decision making (see chart for details).    MDM Rules/Calculators/A&P                         34 year old male presents to the ED due to nontraumatic left-sided low back pain x1 month.  Denies lower extremity weakness, lower extremity numbness/tingling, saddle paresthesias, bowel/bladder incontinence, history of cancer, fever, chills, and IV drug use.  No previous injury.  Upon arrival, vitals all within normal limits.  Patient in no acute distress and rather well-appearing.  Physical exam reassuring.  Reproducible left-sided lumbar paraspinal tenderness.  Lower extremities neurovascularly intact.  Patient able to ambulate in the ED without difficulty.  No thoracic or lumbar midline tenderness. X-rays not warranted at this time. Low suspicion for bony fractures given no history of trauma and no midline tenderness. Will treat symptomatically with Lidoderm patch and Toradol here in the ED. Broad differential for back pain considered includes malignancy, disc herniation, spinal  epidural abscess, spinal fracture, cauda equina, pyelonephritis, kidney stone, AAA, AD, pancreatitis, PE and PTX.   History without red flags (cancer, IVDU, weakness, saddle anesthesia, trauma, weight loss) and physical exam most consistent with muscular strain. Doubt cauda equina or disc herniation due to lack of saddle anesthesia/bowel or bladder incontinence or urinary retention, normal gait and reassuring physical examination without neurologic deficits. History is not supportive of kidney stone, AAA, AD, pancreatitis, PE or PTX. Patient has no CVA tenderness or urinary symptoms to suggest pyelonephritis or kidney stone.   Will manage patient conservatively at this time. NSAIDs, back exercises/stretches, heat therapy and follow up with PCP if symptoms do not resolve in 3-4 weeks. Patient offered muscle relaxer for comfort at night. Counseled on need to return to ED  for fever, worsening or concerning symptoms. Patient agreeable to plan and states understanding of follow up plans and return precautions.  Final Clinical Impression(s) / ED Diagnoses Final diagnoses:  Acute left-sided low back pain without sciatica    Rx / DC Orders ED Discharge Orders         Ordered    methocarbamol (ROBAXIN) 500 MG tablet  2 times daily        06/04/20 0935    naproxen (NAPROSYN) 500 MG tablet  2 times daily        06/04/20 0935           Mannie Stabile, PA-C 06/04/20 1010    Margarita Grizzle, MD 06/07/20 1324

## 2020-06-04 NOTE — Discharge Instructions (Addendum)
As discussed, your low back pain is most likely muscular in nature.  I am sending home with a muscle relaxer and pain medication.  Take as prescribed.  Muscle relaxer can cause drowsiness do not drive or operate machinery while on the medication.  I am also including low back exercises.  Perform daily.  You may also purchase over-the-counter Lidoderm patches and Voltaren gel for added pain relief.  Low back pain typically takes 6 weeks to improve.  Follow-up with PCP if symptoms do not improve within the next few weeks.  Return to the ER for new or worsening symptoms.

## 2020-06-04 NOTE — ED Triage Notes (Signed)
Pt complaint of back pain that has been getting progressively worse for the last month. After standing a lot yesterday pain is much worse.

## 2020-11-23 ENCOUNTER — Encounter: Payer: Self-pay | Admitting: Nurse Practitioner

## 2020-11-23 ENCOUNTER — Other Ambulatory Visit: Payer: Self-pay

## 2020-11-23 ENCOUNTER — Ambulatory Visit (INDEPENDENT_AMBULATORY_CARE_PROVIDER_SITE_OTHER): Payer: Commercial Managed Care - PPO | Admitting: Nurse Practitioner

## 2020-11-23 VITALS — BP 107/68 | HR 68 | Temp 98.1°F | Ht 66.0 in | Wt 197.9 lb

## 2020-11-23 DIAGNOSIS — Z7689 Persons encountering health services in other specified circumstances: Secondary | ICD-10-CM

## 2020-11-23 DIAGNOSIS — J3089 Other allergic rhinitis: Secondary | ICD-10-CM | POA: Diagnosis not present

## 2020-11-23 DIAGNOSIS — J4540 Moderate persistent asthma, uncomplicated: Secondary | ICD-10-CM

## 2020-11-23 DIAGNOSIS — R079 Chest pain, unspecified: Secondary | ICD-10-CM

## 2020-11-23 DIAGNOSIS — Z0000 Encounter for general adult medical examination without abnormal findings: Secondary | ICD-10-CM

## 2020-11-23 DIAGNOSIS — R635 Abnormal weight gain: Secondary | ICD-10-CM

## 2020-11-23 MED ORDER — FLUTICASONE-SALMETEROL 500-50 MCG/ACT IN AEPB: 1.0000 | INHALATION_SPRAY | Freq: Two times a day (BID) | RESPIRATORY_TRACT | 3 refills | Status: DC

## 2020-11-23 MED ORDER — ALBUTEROL SULFATE HFA 108 (90 BASE) MCG/ACT IN AERS: 6.0000 | INHALATION_SPRAY | Freq: Four times a day (QID) | RESPIRATORY_TRACT | 3 refills | Status: DC | PRN

## 2020-11-23 MED ORDER — FLUTICASONE PROPIONATE 50 MCG/ACT NA SUSP: 2.0000 | Freq: Every day | NASAL | 3 refills | Status: DC

## 2020-11-23 MED ORDER — ALBUTEROL SULFATE HFA 108 (90 BASE) MCG/ACT IN AERS: 2.0000 | INHALATION_SPRAY | Freq: Four times a day (QID) | RESPIRATORY_TRACT | 3 refills | Status: DC | PRN

## 2020-11-23 NOTE — Progress Notes (Signed)
New Patient Office Visit  Subjective:  Patient ID: Scott Marquez, male    DOB: 10/30/1985  Age: 35 y.o. MRN: [id number]  CC:  Chief Complaint  Patient presents with  . New Patient (Initial Visit)    HPI Scott Marquez presents to reestablish primary care provider. He is concerned about unintentional weight gain. States that he feels as though he eats more than he should. He wants t weight between 175 and 180 pounds. Has tried to exercise portion control. States that this will last for about a week, but then he feels like he is starving. At that point, he overindulges, especially in sweet food. He states that he does drink plenty of water.  The patient states that asthma has been well managed. Uses advair twice daily and rarely needs to use rescue inhaler. He uses nasal spray daily.  The patient reports that he did have episode of left sided chest pain over the past weekend. He states that pain radiated down his arm. He states that this lasted for a few hours then resolved on it's own. He believes this might be stress related. Has not had this happen before or since single episode.   Past Medical History:  Diagnosis Date  . Asthma     Past Surgical History:  Procedure Laterality Date  . TONSILLECTOMY      Family History  Problem Relation Age of Onset  . Heart attack Father     Social History   Socioeconomic History  . Marital status: Married    Spouse name: Not on file  . Number of children: Not on file  . Years of education: Not on file  . Highest education level: Not on file  Occupational History  . Not on file  Tobacco Use  . Smoking status: Never Smoker  . Smokeless tobacco: Never Used  Vaping Use  . Vaping Use: Never used  Substance and Sexual Activity  . Alcohol use: Never  . Drug use: Never  . Sexual activity: Yes    Birth control/protection: None, Surgical  Other Topics Concern  . Not on file  Social History Narrative  . Not on file   Social  Determinants of Health   Financial Resource Strain: Not on file  Food Insecurity: Not on file  Transportation Needs: Not on file  Physical Activity: Not on file  Stress: Not on file  Social Connections: Not on file  Intimate Partner Violence: Not on file    ROS Review of Systems  Constitutional: Negative for activity change, chills and fever.  HENT: Negative for congestion, postnasal drip, rhinorrhea, sinus pressure, sinus pain and sore throat.   Eyes: Negative.   Respiratory: Negative for cough, chest tightness, shortness of breath and wheezing.        Asthma has been well controlled   Cardiovascular: Positive for chest pain. Negative for palpitations.       Single episode of chest pain over the weekend. Lasted for a few hours then resolved.   Gastrointestinal: Negative for constipation, diarrhea, nausea and vomiting.  Endocrine: Negative.  Negative for cold intolerance, heat intolerance, polydipsia and polyuria.  Genitourinary: Negative.   Musculoskeletal: Negative.  Negative for back pain and myalgias.  Skin: Negative for rash.  Allergic/Immunologic: Positive for environmental allergies.  Neurological: Negative for dizziness, weakness and headaches.  Hematological: Negative.   Psychiatric/Behavioral: Negative for dysphoric mood. The patient is not nervous/anxious.   All other systems reviewed and are negative.   Objective:   Today's  Vitals   11/23/20 0912  BP: 107/68  Pulse: 68  Temp: 98.1 F (36.7 C)  SpO2: 97%  Weight: 197 lb 14.4 oz (89.8 kg)  Height: 5\' 6"  (1.676 m)   Body mass index is 31.94 kg/m.   Physical Exam Vitals and nursing note reviewed.  Constitutional:      Appearance: Normal appearance. He is well-developed.  HENT:     Head: Normocephalic and atraumatic.     Mouth/Throat:     Mouth: Mucous membranes are moist.     Pharynx: Oropharynx is clear.  Eyes:     Extraocular Movements: Extraocular movements intact.     Conjunctiva/sclera:  Conjunctivae normal.     Pupils: Pupils are equal, round, and reactive to light.  Cardiovascular:     Rate and Rhythm: Normal rate and regular rhythm.     Pulses: Normal pulses.     Heart sounds: Normal heart sounds.     Comments: ECG done today shows sinus bradycardia. Otherwise, within normal limits.  Pulmonary:     Effort: Pulmonary effort is normal.     Breath sounds: Normal breath sounds.  Abdominal:     Palpations: Abdomen is soft.  Musculoskeletal:        General: Normal range of motion.     Cervical back: Normal range of motion and neck supple.  Skin:    General: Skin is warm and dry.     Capillary Refill: Capillary refill takes less than 2 seconds.  Neurological:     General: No focal deficit present.     Mental Status: He is alert and oriented to person, place, and time.  Psychiatric:        Mood and Affect: Mood normal.        Behavior: Behavior normal.        Thought Content: Thought content normal.        Judgment: Judgment normal.     Assessment & Plan:  1. Encounter to establish care Appointment today to establish new primary care provider   2. Chest pain, unspecified type EKG today showing sinus bradycardia. No other abnormalities present. Will monitor.  - EKG 12-Lead  3. Moderate persistent asthma without complication Well mange.d continue advair 500/50 twice daily. Use rescue inhaler as needed and as prescribed. Refills sent to pharmacy today. - fluticasone-salmeterol (ADVAIR) 500-50 MCG/ACT AEPB; Inhale 1 puff into the lungs in the morning and at bedtime.  Dispense: 60 each; Refill: 3 - albuterol (VENTOLIN HFA) 108 (90 Base) MCG/ACT inhaler; Inhale 2 puffs into the lungs every 6 (six) hours as needed for wheezing or shortness of breath.  Dispense: 1 each; Refill: 3  4. Perennial allergic rhinitis Continue flonase nasal spray. Refills sent to pharmacy - fluticasone (FLONASE) 50 MCG/ACT nasal spray; Place 2 sprays into both nostrils daily.  Dispense: 16 g;  Refill: 3  5. Abnormal weight gain Check thyroid panel along with routine, fasting labs.  - Lipid panel - TSH - T4, free  6. Healthcare maintenance Check routine, fasting labs today. - CBC with Differential/Platelet - Comprehensive metabolic panel - Lipid panel - TSH - T4, free - HgB A1c  Problem List Items Addressed This Visit      Respiratory   Moderate persistent asthma   Relevant Medications   fluticasone-salmeterol (ADVAIR) 500-50 MCG/ACT AEPB   albuterol (VENTOLIN HFA) 108 (90 Base) MCG/ACT inhaler   Perennial allergic rhinitis   Relevant Medications   fluticasone (FLONASE) 50 MCG/ACT nasal spray     Other  Healthcare maintenance   Relevant Orders   CBC with Differential/Platelet (Completed)   Comprehensive metabolic panel (Completed)   Lipid panel (Completed)   TSH (Completed)   T4, free   HgB A1c   Encounter to establish care - Primary   Chest pain   Relevant Orders   EKG 12-Lead (Completed)   Abnormal weight gain   Relevant Orders   Lipid panel (Completed)   TSH (Completed)   T4, free      Outpatient Encounter Medications as of 11/23/2020  Medication Sig  . fluticasone-salmeterol (ADVAIR) 500-50 MCG/ACT AEPB Inhale 1 puff into the lungs in the morning and at bedtime.  . methocarbamol (ROBAXIN) 500 MG tablet Take 1 tablet (500 mg total) by mouth 2 (two) times daily.  . naproxen (NAPROSYN) 500 MG tablet Take 1 tablet (500 mg total) by mouth 2 (two) times daily.  . [DISCONTINUED] ADVAIR DISKUS 500-50 MCG/DOSE AEPB Inhale 1 puff into the lungs daily as needed.  . [DISCONTINUED] albuterol (VENTOLIN HFA) 108 (90 Base) MCG/ACT inhaler Inhale 6 puffs into the lungs every 6 (six) hours as needed for wheezing or shortness of breath.  . [DISCONTINUED] fluticasone (FLONASE) 50 MCG/ACT nasal spray Place 2 sprays into both nostrils daily.  Marland Kitchen albuterol (VENTOLIN HFA) 108 (90 Base) MCG/ACT inhaler Inhale 2 puffs into the lungs every 6 (six) hours as needed for  wheezing or shortness of breath.  . fluticasone (FLONASE) 50 MCG/ACT nasal spray Place 2 sprays into both nostrils daily.  . [DISCONTINUED] albuterol (VENTOLIN HFA) 108 (90 Base) MCG/ACT inhaler Inhale 6 puffs into the lungs every 6 (six) hours as needed for wheezing or shortness of breath.   No facility-administered encounter medications on file as of 11/23/2020.    Follow-up: Return in about 6 weeks (around 01/04/2021) for patient needs to reschedule appointment for wellness check. Marland Kitchen   Carlean Jews, NP

## 2020-11-23 NOTE — Patient Instructions (Signed)
Calorie Counting for Weight Loss Calories are units of energy. Your body needs a certain number of calories from food to keep going throughout the day. When you eat or drink more calories than your body needs, your body stores the extra calories mostly as fat. When you eat or drink fewer calories than your body needs, your body burns fat to get the energy it needs. Calorie counting means keeping track of how many calories you eat and drink each day. Calorie counting can be helpful if you need to lose weight. If you eat fewer calories than your body needs, you should lose weight. Ask your health care provider what a healthy weight is for you. For calorie counting to work, you will need to eat the right number of calories each day to lose a healthy amount of weight per week. A dietitian can help you figure out how many calories you need in a day and will suggest ways to reach your calorie goal.  A healthy amount of weight to lose each week is usually 1-2 lb (0.5-0.9 kg). This usually means that your daily calorie intake should be reduced by 500-750 calories.  Eating 1,200-1,500 calories a day can help most women lose weight.  Eating 1,500-1,800 calories a day can help most men lose weight. What do I need to know about calorie counting? Work with your health care provider or dietitian to determine how many calories you should get each day. To meet your daily calorie goal, you will need to:  Find out how many calories are in each food that you would like to eat. Try to do this before you eat.  Decide how much of the food you plan to eat.  Keep a food log. Do this by writing down what you ate and how many calories it had. To successfully lose weight, it is important to balance calorie counting with a healthy lifestyle that includes regular activity. Where do I find calorie information? The number of calories in a food can be found on a Nutrition Facts label. If a food does not have a Nutrition Facts  label, try to look up the calories online or ask your dietitian for help. Remember that calories are listed per serving. If you choose to have more than one serving of a food, you will have to multiply the calories per serving by the number of servings you plan to eat. For example, the label on a package of bread might say that a serving size is 1 slice and that there are 90 calories in a serving. If you eat 1 slice, you will have eaten 90 calories. If you eat 2 slices, you will have eaten 180 calories.   How do I keep a food log? After each time that you eat, record the following in your food log as soon as possible:  What you ate. Be sure to include toppings, sauces, and other extras on the food.  How much you ate. This can be measured in cups, ounces, or number of items.  How many calories were in each food and drink.  The total number of calories in the food you ate. Keep your food log near you, such as in a pocket-sized notebook or on an app or website on your mobile phone. Some programs will calculate calories for you and show you how many calories you have left to meet your daily goal. What are some portion-control tips?  Know how many calories are in a serving. This will   help you know how many servings you can have of a certain food.  Use a measuring cup to measure serving sizes. You could also try weighing out portions on a kitchen scale. With time, you will be able to estimate serving sizes for some foods.  Take time to put servings of different foods on your favorite plates or in your favorite bowls and cups so you know what a serving looks like.  Try not to eat straight from a food's packaging, such as from a bag or box. Eating straight from the package makes it hard to see how much you are eating and can lead to overeating. Put the amount you would like to eat in a cup or on a plate to make sure you are eating the right portion.  Use smaller plates, glasses, and bowls for smaller  portions and to prevent overeating.  Try not to multitask. For example, avoid watching TV or using your computer while eating. If it is time to eat, sit down at a table and enjoy your food. This will help you recognize when you are full. It will also help you be more mindful of what and how much you are eating. What are tips for following this plan? Reading food labels  Check the calorie count compared with the serving size. The serving size may be smaller than what you are used to eating.  Check the source of the calories. Try to choose foods that are high in protein, fiber, and vitamins, and low in saturated fat, trans fat, and sodium. Shopping  Read nutrition labels while you shop. This will help you make healthy decisions about which foods to buy.  Pay attention to nutrition labels for low-fat or fat-free foods. These foods sometimes have the same number of calories or more calories than the full-fat versions. They also often have added sugar, starch, or salt to make up for flavor that was removed with the fat.  Make a grocery list of lower-calorie foods and stick to it. Cooking  Try to cook your favorite foods in a healthier way. For example, try baking instead of frying.  Use low-fat dairy products. Meal planning  Use more fruits and vegetables. One-half of your plate should be fruits and vegetables.  Include lean proteins, such as chicken, turkey, and fish. Lifestyle Each week, aim to do one of the following:  150 minutes of moderate exercise, such as walking.  75 minutes of vigorous exercise, such as running. General information  Know how many calories are in the foods you eat most often. This will help you calculate calorie counts faster.  Find a way of tracking calories that works for you. Get creative. Try different apps or programs if writing down calories does not work for you. What foods should I eat?  Eat nutritious foods. It is better to have a nutritious,  high-calorie food, such as an avocado, than a food with few nutrients, such as a bag of potato chips.  Use your calories on foods and drinks that will fill you up and will not leave you hungry soon after eating. ? Examples of foods that fill you up are nuts and nut butters, vegetables, lean proteins, and high-fiber foods such as whole grains. High-fiber foods are foods with more than 5 g of fiber per serving.  Pay attention to calories in drinks. Low-calorie drinks include water and unsweetened drinks. The items listed above may not be a complete list of foods and beverages you can eat.   Contact a dietitian for more information.   What foods should I limit? Limit foods or drinks that are not good sources of vitamins, minerals, or protein or that are high in unhealthy fats. These include:  Candy.  Other sweets.  Sodas, specialty coffee drinks, alcohol, and juice. The items listed above may not be a complete list of foods and beverages you should avoid. Contact a dietitian for more information. How do I count calories when eating out?  Pay attention to portions. Often, portions are much larger when eating out. Try these tips to keep portions smaller: ? Consider sharing a meal instead of getting your own. ? If you get your own meal, eat only half of it. Before you start eating, ask for a container and put half of your meal into it. ? When available, consider ordering smaller portions from the menu instead of full portions.  Pay attention to your food and drink choices. Knowing the way food is cooked and what is included with the meal can help you eat fewer calories. ? If calories are listed on the menu, choose the lower-calorie options. ? Choose dishes that include vegetables, fruits, whole grains, low-fat dairy products, and lean proteins. ? Choose items that are boiled, broiled, grilled, or steamed. Avoid items that are buttered, battered, fried, or served with cream sauce. Items labeled as  crispy are usually fried, unless stated otherwise. ? Choose water, low-fat milk, unsweetened iced tea, or other drinks without added sugar. If you want an alcoholic beverage, choose a lower-calorie option, such as a glass of wine or light beer. ? Ask for dressings, sauces, and syrups on the side. These are usually high in calories, so you should limit the amount you eat. ? If you want a salad, choose a garden salad and ask for grilled meats. Avoid extra toppings such as bacon, cheese, or fried items. Ask for the dressing on the side, or ask for olive oil and vinegar or lemon to use as dressing.  Estimate how many servings of a food you are given. Knowing serving sizes will help you be aware of how much food you are eating at restaurants. Where to find more information  Centers for Disease Control and Prevention: www.cdc.gov  [place] Department of Agriculture: myplate.gov Summary  Calorie counting means keeping track of how many calories you eat and drink each day. If you eat fewer calories than your body needs, you should lose weight.  A healthy amount of weight to lose per week is usually 1-2 lb (0.5-0.9 kg). This usually means reducing your daily calorie intake by 500-750 calories.  The number of calories in a food can be found on a Nutrition Facts label. If a food does not have a Nutrition Facts label, try to look up the calories online or ask your dietitian for help.  Use smaller plates, glasses, and bowls for smaller portions and to prevent overeating.  Use your calories on foods and drinks that will fill you up and not leave you hungry shortly after a meal. This information is not intended to replace advice given to you by your health care provider. Make sure you discuss any questions you have with your health care provider. Document Revised: 08/07/2019 Document Reviewed: 08/07/2019 Elsevier Patient Education  2021 [employer] Inc.  

## 2020-11-24 DIAGNOSIS — R079 Chest pain, unspecified: Secondary | ICD-10-CM | POA: Insufficient documentation

## 2020-11-24 DIAGNOSIS — J3089 Other allergic rhinitis: Secondary | ICD-10-CM | POA: Insufficient documentation

## 2020-11-24 DIAGNOSIS — R635 Abnormal weight gain: Secondary | ICD-10-CM | POA: Insufficient documentation

## 2020-11-24 DIAGNOSIS — Z7689 Persons encountering health services in other specified circumstances: Secondary | ICD-10-CM | POA: Insufficient documentation

## 2020-11-24 LAB — CBC WITH DIFFERENTIAL/PLATELET
Basophils Absolute: 0 10*3/uL (ref 0.0–0.2)
Basos: 1 %
EOS (ABSOLUTE): 0.2 10*3/uL (ref 0.0–0.4)
Eos: 4 %
Hematocrit: 48 % (ref 37.5–51.0)
Hemoglobin: 16.9 g/dL (ref 13.0–17.7)
Immature Grans (Abs): 0 10*3/uL (ref 0.0–0.1)
Immature Granulocytes: 0 %
Lymphocytes Absolute: 1.8 10*3/uL (ref 0.7–3.1)
Lymphs: 28 %
MCH: 31.8 pg (ref 26.6–33.0)
MCHC: 35.2 g/dL (ref 31.5–35.7)
MCV: 90 fL (ref 79–97)
Monocytes Absolute: 0.4 10*3/uL (ref 0.1–0.9)
Monocytes: 7 %
Neutrophils Absolute: 4 10*3/uL (ref 1.4–7.0)
Neutrophils: 60 %
Platelets: 348 10*3/uL (ref 150–450)
RBC: 5.32 x10E6/uL (ref 4.14–5.80)
RDW: 12.7 % (ref 11.6–15.4)
WBC: 6.5 10*3/uL (ref 3.4–10.8)

## 2020-11-24 LAB — HEMOGLOBIN A1C
Est. average glucose Bld gHb Est-mCnc: 103 mg/dL
Hgb A1c MFr Bld: 5.2 % (ref 4.8–5.6)

## 2020-11-24 LAB — COMPREHENSIVE METABOLIC PANEL
ALT: 14 IU/L (ref 0–44)
AST: 24 IU/L (ref 0–40)
Albumin/Globulin Ratio: 1.7 (ref 1.2–2.2)
Albumin: 4.5 g/dL (ref 4.0–5.0)
Alkaline Phosphatase: 64 IU/L (ref 44–121)
BUN/Creatinine Ratio: 15 (ref 9–20)
BUN: 14 mg/dL (ref 6–20)
Bilirubin Total: 0.6 mg/dL (ref 0.0–1.2)
CO2: 25 mmol/L (ref 20–29)
Calcium: 9.7 mg/dL (ref 8.7–10.2)
Chloride: 99 mmol/L (ref 96–106)
Creatinine, Ser: 0.94 mg/dL (ref 0.76–1.27)
Globulin, Total: 2.6 g/dL (ref 1.5–4.5)
Glucose: 88 mg/dL (ref 65–99)
Potassium: 4.6 mmol/L (ref 3.5–5.2)
Sodium: 139 mmol/L (ref 134–144)
Total Protein: 7.1 g/dL (ref 6.0–8.5)
eGFR: 109 mL/min/{1.73_m2} (ref >59)

## 2020-11-24 LAB — LIPID PANEL
Chol/HDL Ratio: 4.3 ratio (ref 0.0–5.0)
Cholesterol, Total: 234 mg/dL — ABNORMAL HIGH (ref 100–199)
HDL: 55 mg/dL (ref >39)
LDL Chol Calc (NIH): 162 mg/dL — ABNORMAL HIGH (ref 0–99)
Triglycerides: 94 mg/dL (ref 0–149)
VLDL Cholesterol Cal: 17 mg/dL (ref 5–40)

## 2020-11-24 LAB — TSH: TSH: 0.951 u[IU]/mL (ref 0.450–4.500)

## 2020-11-24 LAB — T4, FREE: Free T4: 1.23 ng/dL (ref 0.82–1.77)

## 2020-11-24 NOTE — Progress Notes (Signed)
Sinus bradycardia on ECG. Reviewed with patient during visit

## 2020-11-24 NOTE — Progress Notes (Signed)
Mildly elevated LDL and total cholesterol. Will discuss with patient at visit for physical.

## 2020-12-10 ENCOUNTER — Encounter: Payer: Commercial Managed Care - PPO | Admitting: Nurse Practitioner

## 2020-12-13 ENCOUNTER — Other Ambulatory Visit: Payer: Self-pay

## 2020-12-13 ENCOUNTER — Encounter: Payer: Self-pay | Admitting: Nurse Practitioner

## 2020-12-13 ENCOUNTER — Ambulatory Visit (INDEPENDENT_AMBULATORY_CARE_PROVIDER_SITE_OTHER): Payer: Commercial Managed Care - PPO | Admitting: Nurse Practitioner

## 2020-12-13 VITALS — BP 104/65 | HR 67 | Temp 98.8°F | Ht 66.0 in | Wt 192.6 lb

## 2020-12-13 DIAGNOSIS — J4540 Moderate persistent asthma, uncomplicated: Secondary | ICD-10-CM | POA: Diagnosis not present

## 2020-12-13 DIAGNOSIS — E7801 Familial hypercholesterolemia: Secondary | ICD-10-CM | POA: Diagnosis not present

## 2020-12-13 DIAGNOSIS — E78019 Familial hypercholesterolemia, unspecified: Secondary | ICD-10-CM | POA: Insufficient documentation

## 2020-12-13 DIAGNOSIS — Z0000 Encounter for general adult medical examination without abnormal findings: Secondary | ICD-10-CM | POA: Diagnosis not present

## 2020-12-13 NOTE — Progress Notes (Signed)
Established Patient Office Visit  Subjective:  Patient ID: Scott Marquez, male    DOB: 02-Mar-1986  Age: 35 y.o. MRN: [id number]  CC:  Chief Complaint  Patient presents with  . Annual Exam    HPI GABLE Marquez presents for annual wellness visit. He had routine labs prior to this visit showing mild elevation of lipid panel.   Lipid Panel     Component Value Date/Time   CHOL 234 (H) 11/23/2020 0953   TRIG 94 11/23/2020 0953   HDL 55 11/23/2020 0953   CHOLHDL 4.3 11/23/2020 0953   LDLCALC 162 (H) 11/23/2020 0953   LABVLDL 17 11/23/2020 0953   He states that he has already started making dietary changes. Has cut out fast food meals. Has stopped soda intake and increased intake of water. States that he is drinking more than 64 ounces of water per day. States that he has already lost nearly 7 pounds since he started with diet changes. He states that even his asthma has improved since dietary changes were made. Breathing better. States that he thinks his sleep quality has improved.  All other labs were within normal limits.  He denies other concerns or complaints today. He denies chest pain, chest pressure, or shortness of breath. He denies headaches or visual disturbances. He denies abdominal pain, nausea, vomiting, or changes in bowel or bladder habits.   Past Medical History:  Diagnosis Date  . Asthma     Past Surgical History:  Procedure Laterality Date  . TONSILLECTOMY      Family History  Problem Relation Age of Onset  . Heart attack Father     Social History   Socioeconomic History  . Marital status: Married    Spouse name: Not on file  . Number of children: Not on file  . Years of education: Not on file  . Highest education level: Not on file  Occupational History  . Not on file  Tobacco Use  . Smoking status: Never Smoker  . Smokeless tobacco: Never Used  Vaping Use  . Vaping Use: Never used  Substance and Sexual Activity  . Alcohol use: Never  .  Drug use: Never  . Sexual activity: Yes    Birth control/protection: None, Surgical  Other Topics Concern  . Not on file  Social History Narrative  . Not on file   Social Determinants of Health   Financial Resource Strain: Not on file  Food Insecurity: Not on file  Transportation Needs: Not on file  Physical Activity: Not on file  Stress: Not on file  Social Connections: Not on file  Intimate Partner Violence: Not on file    Outpatient Medications Prior to Visit  Medication Sig Dispense Refill  . albuterol (VENTOLIN HFA) 108 (90 Base) MCG/ACT inhaler Inhale 2 puffs into the lungs every 6 (six) hours as needed for wheezing or shortness of breath. 1 each 3  . fluticasone (FLONASE) 50 MCG/ACT nasal spray Place 2 sprays into both nostrils daily. 16 g 3  . fluticasone-salmeterol (ADVAIR) 500-50 MCG/ACT AEPB Inhale 1 puff into the lungs in the morning and at bedtime. 60 each 3  . methocarbamol (ROBAXIN) 500 MG tablet Take 1 tablet (500 mg total) by mouth 2 (two) times daily. 20 tablet 0  . naproxen (NAPROSYN) 500 MG tablet Take 1 tablet (500 mg total) by mouth 2 (two) times daily. 30 tablet 0   No facility-administered medications prior to visit.    No Known Allergies  ROS Review  of Systems  Constitutional: Negative for activity change, chills and fever.  HENT: Negative for congestion, postnasal drip, rhinorrhea, sinus pressure and sinus pain.   Eyes: Negative.   Respiratory: Negative for cough, shortness of breath and wheezing.   Cardiovascular: Negative for chest pain and palpitations.  Gastrointestinal: Negative for constipation, diarrhea, nausea and vomiting.  Endocrine: Negative for cold intolerance, heat intolerance, polydipsia and polyuria.  Genitourinary: Negative for dysuria, frequency, scrotal swelling, testicular pain and urgency.  Musculoskeletal: Negative.  Negative for back pain and myalgias.  Skin: Negative.  Negative for rash.  Allergic/Immunologic: Negative.    Neurological: Negative for dizziness, weakness and headaches.  Psychiatric/Behavioral: Negative.  The patient is not nervous/anxious.   All other systems reviewed and are negative.     Objective:    Physical Exam Vitals and nursing note reviewed.  Constitutional:      Appearance: Normal appearance. He is well-developed.  HENT:     Head: Normocephalic and atraumatic.     Right Ear: Ear canal and external ear normal.     Left Ear: Ear canal and external ear normal.     Nose: Nose normal.     Mouth/Throat:     Mouth: Mucous membranes are moist.     Pharynx: Oropharynx is clear.  Eyes:     Extraocular Movements: Extraocular movements intact.     Conjunctiva/sclera: Conjunctivae normal.     Pupils: Pupils are equal, round, and reactive to light.  Cardiovascular:     Rate and Rhythm: Normal rate and regular rhythm.     Pulses: Normal pulses.  Pulmonary:     Effort: Pulmonary effort is normal.     Breath sounds: Normal breath sounds.  Abdominal:     General: Bowel sounds are normal.     Palpations: Abdomen is soft.     Tenderness: There is no abdominal tenderness.  Musculoskeletal:        General: Normal range of motion.     Cervical back: Normal range of motion and neck supple.  Skin:    General: Skin is warm and dry.     Capillary Refill: Capillary refill takes less than 2 seconds.  Neurological:     General: No focal deficit present.     Mental Status: He is alert and oriented to person, place, and time.     Cranial Nerves: No cranial nerve deficit.     Sensory: No sensory deficit.     Motor: No weakness.     Gait: Gait normal.  Psychiatric:        Mood and Affect: Mood normal.        Behavior: Behavior normal.        Thought Content: Thought content normal.        Judgment: Judgment normal.     Today's Vitals   12/13/20 1324  BP: 104/65  Pulse: 67  Temp: 98.8 F (37.1 C)  SpO2: 98%  Weight: 192 lb 9.6 oz (87.4 kg)  Height: '5\' 6"'  (1.676 m)   Body mass  index is 31.09 kg/m.   Wt Readings from Last 3 Encounters:  12/13/20 192 lb 9.6 oz (87.4 kg)  11/23/20 197 lb 14.4 oz (89.8 kg)  10/29/18 192 lb 4.8 oz (87.2 kg)     Health Maintenance Due  Topic Date Due  . COVID-19 Vaccine (1) Never done  . Pneumococcal Vaccine 63-72 Years old (1 of 2 - PPSV23) Never done  . TETANUS/TDAP  Never done    There are no  preventive care reminders to display for this patient.  Lab Results  Component Value Date   TSH 0.951 11/23/2020   Lab Results  Component Value Date   WBC 6.5 11/23/2020   HGB 16.9 11/23/2020   HCT 48.0 11/23/2020   MCV 90 11/23/2020   PLT 348 11/23/2020   Lab Results  Component Value Date   NA 139 11/23/2020   K 4.6 11/23/2020   CO2 25 11/23/2020   GLUCOSE 88 11/23/2020   BUN 14 11/23/2020   CREATININE 0.94 11/23/2020   BILITOT 0.6 11/23/2020   ALKPHOS 64 11/23/2020   AST 24 11/23/2020   ALT 14 11/23/2020   PROT 7.1 11/23/2020   ALBUMIN 4.5 11/23/2020   CALCIUM 9.7 11/23/2020   EGFR 109 11/23/2020   Lab Results  Component Value Date   CHOL 234 (H) 11/23/2020   Lab Results  Component Value Date   HDL 55 11/23/2020   Lab Results  Component Value Date   LDLCALC 162 (H) 11/23/2020   Lab Results  Component Value Date   TRIG 94 11/23/2020   Lab Results  Component Value Date   CHOLHDL 4.3 11/23/2020   Lab Results  Component Value Date   HGBA1C 5.2 11/23/2020      Assessment & Plan:  1. Encounter for wellness examination in adult Annual wellness visit today.   2. Familial hypercholesterolemia Reviewed labs which did show elevation of LDL and total cholesterol. Dietary and lifestyle changes discussed. Patient states that he has already started on these changes. Written information provided to support discussion. Recheck lipid panel in one year.  3. Moderate persistent asthma without complication Improved and stable. Continue inhalers as prescribed   Problem List Items Addressed This Visit       Respiratory   Moderate persistent asthma     Other   Encounter for wellness examination in adult - Primary   Familial hypercholesterolemia        Follow-up: Return in about 1 year (around 12/13/2021) for physical - FBW 1 week prior to visit .    Ronnell Freshwater, NP

## 2020-12-13 NOTE — Patient Instructions (Signed)
Preventing High Cholesterol Cholesterol is a white, waxy substance similar to fat that the human body needs to help build cells. The liver makes all the cholesterol that a person's body needs. Having high cholesterol (hypercholesterolemia) increases your risk for heart disease and stroke. Extra or excess cholesterol comes from the food that you eat. High cholesterol can often be prevented with diet and lifestyle changes. If you already have high cholesterol, you can control it with diet, lifestyle changes, and medicines. How can high cholesterol affect me? If you have high cholesterol, fatty deposits (plaques) may build up on the walls of your blood vessels. The blood vessels that carry blood away from your heart are called arteries. Plaques make the arteries narrower and stiffer. This in turn can:  Restrict or block blood flow and cause blood clots to form.  Increase your risk for heart attack and stroke. What can increase my risk for high cholesterol? This condition is more likely to develop in people who:  Eat foods that are high in saturated fat or cholesterol. Saturated fat is mostly found in foods that come from animal sources.  Are overweight.  Are not getting enough exercise.  Have a family history of high cholesterol (familial hypercholesterolemia). What actions can I take to prevent this? Nutrition  Eat less saturated fat.  Avoid trans fats (partially hydrogenated oils). These are often found in margarine and in some baked goods, fried foods, and snacks bought in packages.  Avoid precooked or cured meat, such as bacon, sausages, or meat loaves.  Avoid foods and drinks that have added sugars.  Eat more fruits, vegetables, and whole grains.  Choose healthy sources of protein, such as fish, poultry, lean cuts of red meat, beans, peas, lentils, and nuts.  Choose healthy sources of fat, such as: ? Nuts. ? Vegetable oils, especially olive oil. ? Fish that have healthy fats,  such as omega-3 fatty acids. These fish include mackerel or salmon.   Lifestyle  Lose weight if you are overweight. Maintaining a healthy body mass index (BMI) can help prevent or control high cholesterol. It can also lower your risk for diabetes and high blood pressure. Ask your health care provider to help you with a diet and exercise plan to lose weight safely.  Do not use any products that contain nicotine or tobacco, such as cigarettes, e-cigarettes, and chewing tobacco. If you need help quitting, ask your health care provider. Alcohol use  Do not drink alcohol if: ? Your health care provider tells you not to drink. ? You are pregnant, may be pregnant, or are planning to become pregnant.  If you drink alcohol: ? Limit how much you use to:  0-1 drink a day for women.  0-2 drinks a day for men. ? Be aware of how much alcohol is in your drink. In the [place] one drink equals one 12 oz bottle of beer (355 mL), one 5 oz glass of wine (148 mL), or one 1 oz glass of hard liquor (44 mL). Activity  Get enough exercise. Do exercises as told by your health care provider.  Each week, do at least 150 minutes of exercise that takes a medium level of effort (moderate-intensity exercise). This kind of exercise: ? Makes your heart beat faster while allowing you to still be able to talk. ? Can be done in short sessions several times a day or longer sessions a few times a week. For example, on 5 days each week, you could walk fast or ride   your bike 3 times a day for 10 minutes each time.   Medicines  Your health care provider may recommend medicines to help lower cholesterol. This may be a medicine to lower the amount of cholesterol that your liver makes. You may need medicine if: ? Diet and lifestyle changes have not lowered your cholesterol enough. ? You have high cholesterol and other risk factors for heart disease or stroke.  Take over-the-counter and prescription medicines only as told by your  health care provider. General information  Manage your risk factors for high cholesterol. Talk with your health care provider about all your risk factors and how to lower your risk.  Manage other conditions that you have, such as diabetes or high blood pressure (hypertension).  Have blood tests to check your cholesterol levels at regular points in time as told by your health care provider.  Keep all follow-up visits as told by your health care provider. This is important. Where to find more information  American Heart Association: www.heart.org  National Heart, Lung, and Blood Institute: www.nhlbi.nih.gov Summary  High cholesterol increases your risk for heart disease and stroke. By keeping your cholesterol level low, you can reduce your risk for these conditions.  High cholesterol can often be prevented with diet and lifestyle changes.  Work with your health care provider to manage your risk factors, and have your blood tested regularly. This information is not intended to replace advice given to you by your health care provider. Make sure you discuss any questions you have with your health care provider. Document Revised: 04/08/2019 Document Reviewed: 04/08/2019 Elsevier Patient Education  2021 [employer] Inc.  

## 2020-12-21 ENCOUNTER — Encounter: Payer: Commercial Managed Care - PPO | Admitting: Nurse Practitioner

## 2021-12-21 ENCOUNTER — Ambulatory Visit (INDEPENDENT_AMBULATORY_CARE_PROVIDER_SITE_OTHER): Payer: Commercial Managed Care - PPO | Admitting: Nurse Practitioner

## 2021-12-21 ENCOUNTER — Encounter: Payer: Self-pay | Admitting: Nurse Practitioner

## 2021-12-21 VITALS — BP 111/69 | HR 87 | Temp 97.7°F | Ht 67.0 in | Wt 201.0 lb

## 2021-12-21 DIAGNOSIS — E785 Hyperlipidemia, unspecified: Secondary | ICD-10-CM | POA: Diagnosis not present

## 2021-12-21 DIAGNOSIS — Z0001 Encounter for general adult medical examination with abnormal findings: Secondary | ICD-10-CM

## 2021-12-21 DIAGNOSIS — Z6839 Body mass index (BMI) 39.0-39.9, adult: Secondary | ICD-10-CM

## 2021-12-21 DIAGNOSIS — J4540 Moderate persistent asthma, uncomplicated: Secondary | ICD-10-CM

## 2021-12-21 DIAGNOSIS — J3089 Other allergic rhinitis: Secondary | ICD-10-CM

## 2021-12-21 MED ORDER — FLUTICASONE PROPIONATE 50 MCG/ACT NA SUSP: 2.0000 | Freq: Every day | NASAL | 11 refills | Status: DC

## 2021-12-21 MED ORDER — FLUTICASONE-SALMETEROL 500-50 MCG/ACT IN AEPB: 1.0000 | INHALATION_SPRAY | Freq: Two times a day (BID) | RESPIRATORY_TRACT | 5 refills | Status: DC

## 2021-12-21 MED ORDER — ALBUTEROL SULFATE HFA 108 (90 BASE) MCG/ACT IN AERS: 2.0000 | INHALATION_SPRAY | Freq: Four times a day (QID) | RESPIRATORY_TRACT | 5 refills | Status: AC | PRN

## 2021-12-21 MED ORDER — PHENTERMINE HCL 37.5 MG PO TABS: ORAL_TABLET | ORAL | 0 refills | Status: DC

## 2021-12-21 NOTE — Progress Notes (Signed)
Complete physical exam   Patient: Scott Marquez   DOB: [birth year]   36 y.o. Male  MRN: [id number] Visit Date: 12/21/2021    Chief Complaint  Patient presents with   Annual Exam   Subjective    KEIN CARLBERG is a 36 y.o. male who presents today for a complete physical exam.  He reports consuming a general diet. The patient has a physically strenuous job, but has no regular exercise apart from work.  He generally feels well. He does not have additional problems to discuss today.  HPI  Annual wellness visit.  -well managed asthma -weight not ideal for him -ECG done at initial visit showed sinus bradycardia without other abnormalities.  -routine, fasting labs done.  --slight elevation of LDL and total cholesterol.  --other labs normal.   Past Medical History:  Diagnosis Date   Asthma    Past Surgical History:  Procedure Laterality Date   TONSILLECTOMY     Social History   Socioeconomic History   Marital status: Married    Spouse name: Not on file   Number of children: Not on file   Years of education: Not on file   Highest education level: Not on file  Occupational History   Not on file  Tobacco Use   Smoking status: Never   Smokeless tobacco: Never  Vaping Use   Vaping Use: Never used  Substance and Sexual Activity   Alcohol use: Never   Drug use: Never   Sexual activity: Yes    Birth control/protection: None, Surgical  Other Topics Concern   Not on file  Social History Narrative   Not on file   Social Determinants of Health   Financial Resource Strain: Not on file  Food Insecurity: Not on file  Transportation Needs: Not on file  Physical Activity: Not on file  Stress: Not on file  Social Connections: Not on file  Intimate Partner Violence: Not on file   Family Status  Relation Name Status   Father  (Not Specified)   Family History  Problem Relation Age of Onset   Heart attack Father    No Known Allergies  Patient Care Team: Ronnell Freshwater, NP as PCP - General (Family Medicine)   Medications: Outpatient Medications Prior to Visit  Medication Sig   [DISCONTINUED] albuterol (VENTOLIN HFA) 108 (90 Base) MCG/ACT inhaler Inhale 2 puffs into the lungs every 6 (six) hours as needed for wheezing or shortness of breath.   [DISCONTINUED] fluticasone (FLONASE) 50 MCG/ACT nasal spray Place 2 sprays into both nostrils daily.   [DISCONTINUED] fluticasone-salmeterol (ADVAIR) 500-50 MCG/ACT AEPB Inhale 1 puff into the lungs in the morning and at bedtime.   No facility-administered medications prior to visit.    Review of Systems  Constitutional:  Negative for activity change, chills, fatigue and fever.  HENT:  Negative for congestion, postnasal drip, rhinorrhea, sinus pressure, sinus pain, sneezing and sore throat.   Eyes: Negative.   Respiratory:  Negative for cough, shortness of breath and wheezing.        Well controlled asthma   Cardiovascular:  Negative for chest pain and palpitations.  Gastrointestinal:  Negative for constipation, diarrhea, nausea and vomiting.  Endocrine: Negative for cold intolerance, heat intolerance, polydipsia and polyuria.  Genitourinary:  Negative for dysuria, frequency and urgency.  Musculoskeletal:  Negative for back pain and myalgias.  Skin:  Negative for rash.  Allergic/Immunologic: Positive for environmental allergies.  Neurological:  Negative for dizziness, weakness and headaches.  Psychiatric/Behavioral:  The patient is not nervous/anxious.        Objective     Today's Vitals   12/21/21 1310  BP: 111/69  Pulse: 87  Temp: 97.7 F (36.5 C)  SpO2: 98%  Weight: 201 lb (91.2 kg)  Height: '5\' 7"'  (1.702 m)   Body mass index is 31.48 kg/m.   BP Readings from Last 3 Encounters:  12/21/21 111/69  12/13/20 104/65  11/23/20 107/68    Wt Readings from Last 3 Encounters:  12/21/21 201 lb (91.2 kg)  12/13/20 192 lb 9.6 oz (87.4 kg)  11/23/20 197 lb 14.4 oz (89.8 kg)     Physical  Exam Vitals and nursing note reviewed.  Constitutional:      Appearance: Normal appearance. He is well-developed.  HENT:     Head: Normocephalic and atraumatic.     Right Ear: Tympanic membrane, ear canal and external ear normal.     Left Ear: Tympanic membrane, ear canal and external ear normal.     Nose: Nose normal.     Mouth/Throat:     Mouth: Mucous membranes are moist.     Pharynx: Oropharynx is clear.  Eyes:     Extraocular Movements: Extraocular movements intact.     Conjunctiva/sclera: Conjunctivae normal.     Pupils: Pupils are equal, round, and reactive to light.  Cardiovascular:     Rate and Rhythm: Normal rate and regular rhythm.     Pulses: Normal pulses.     Heart sounds: Normal heart sounds.  Pulmonary:     Effort: Pulmonary effort is normal.     Breath sounds: Normal breath sounds.  Abdominal:     General: Bowel sounds are normal. There is no distension.     Palpations: Abdomen is soft. There is no mass.     Tenderness: There is no abdominal tenderness. There is no right CVA tenderness, left CVA tenderness, guarding or rebound.     Hernia: No hernia is present.  Musculoskeletal:        General: Normal range of motion.     Cervical back: Normal range of motion and neck supple.  Lymphadenopathy:     Cervical: No cervical adenopathy.  Skin:    General: Skin is warm and dry.     Capillary Refill: Capillary refill takes less than 2 seconds.  Neurological:     General: No focal deficit present.     Mental Status: He is alert and oriented to person, place, and time.  Psychiatric:        Mood and Affect: Mood normal.        Behavior: Behavior normal.        Thought Content: Thought content normal.        Judgment: Judgment normal.       Last depression screening scores    12/21/2021    1:11 PM 12/13/2020    1:27 PM 11/23/2020    9:16 AM  PHQ 2/9 Scores  PHQ - 2 Score '3 1 1  ' PHQ- 9 Score '8 5 9   ' Last fall risk screening    12/21/2021    1:11 PM  [place] in the past year? 0  Number falls in past yr: 0  Injury with Fall? 0  Risk for fall due to : No Fall Risks  Follow up Falls evaluation completed     Assessment & Plan    1. Encounter for general adult medical examination with abnormal findings Annual physical today.  2. Moderate persistent asthma without complication Continue Advair 500/50 mcg twice daily.  May use albuterol inhaler as needed as prescribed. - fluticasone-salmeterol (ADVAIR) 500-50 MCG/ACT AEPB; Inhale 1 puff into the lungs in the morning and at bedtime.  Dispense: 60 each; Refill: 5 - albuterol (VENTOLIN HFA) 108 (90 Base) MCG/ACT inhaler; Inhale 2 puffs into the lungs every 6 (six) hours as needed for wheezing or shortness of breath.  Dispense: 1 each; Refill: 5  3. Perennial allergic rhinitis Continue Flonase nasal spray.  Use 2 sprays in both nostrils daily. - fluticasone (FLONASE) 50 MCG/ACT nasal spray; Place 2 sprays into both nostrils daily.  Dispense: 16 g; Refill: 11  4. Dyslipidemia, goal LDL below 100 Recommend patient limit intake of fried and fatty foods. He should increase intake of lean proteins and green leafy vegetables. Adding exercise into daily routine will also be beneficial.    5. BMI 39.0-39.9,adult Trial of phentermine 37.5 mg tablets. Encourage patient to limit calorie intake to 2000 cal/day or less.  He should consume a low cholesterol, low-fat diet.  Recommend he incorporate exercise into his daily routine.  Reassess in 4 weeks. - phentermine (ADIPEX-P) 37.5 MG tablet; Take 1/2 to 1 tablet po QAM  Dispense: 30 tablet; Refill: 0    Immunization History  Administered Date(s) Administered   DTaP 12/29/1986, 02/22/1987, 04/28/1987, 07/14/1988, 01/22/1992   Hepatitis B 04/15/1999, 05/27/1999, 11/04/1999   MMR 07/14/1988   OPV 12/29/1986, 02/22/1987, 07/14/1988, 01/22/1992    Health Maintenance  Topic Date Due   COVID-19 Vaccine (1) Never done   Hepatitis C Screening  Never  done   TETANUS/TDAP  Never done   INFLUENZA VACCINE  02/07/2022   HIV Screening  Completed   HPV VACCINES  Aged Out    Discussed health benefits of physical activity, and encouraged him to engage in regular exercise appropriate for his age and condition.  Problem List Items Addressed This Visit       Respiratory   Moderate persistent asthma   Relevant Medications   fluticasone-salmeterol (ADVAIR) 500-50 MCG/ACT AEPB   albuterol (VENTOLIN HFA) 108 (90 Base) MCG/ACT inhaler   Perennial allergic rhinitis   Relevant Medications   fluticasone (FLONASE) 50 MCG/ACT nasal spray     Other   BMI 39.0-39.9,adult   Relevant Medications   phentermine (ADIPEX-P) 37.5 MG tablet   Dyslipidemia, goal LDL below 100   Other Visit Diagnoses     Encounter for general adult medical examination with abnormal findings    -  Primary        Return in about 4 weeks (around 01/18/2022) for routine - weight management, FBW a week prior to visit.        Ronnell Freshwater, NP  [facility] [phone] (phone) [phone] (fax)  [place]

## 2021-12-21 NOTE — Patient Instructions (Signed)
Preventive Care 21-35 Years Old, Male Preventive care refers to lifestyle choices and visits with your health care provider that can promote health and wellness. Preventive care visits are also called wellness exams. What can I expect for my preventive care visit? Counseling During your preventive care visit, your health care provider may ask about your: Medical history, including: Past medical problems. Family medical history. Current health, including: Emotional well-being. Home life and relationship well-being. Sexual activity. Lifestyle, including: Alcohol, nicotine or tobacco, and drug use. Access to firearms. Diet, exercise, and sleep habits. Safety issues such as seatbelt and bike helmet use. Sunscreen use. Work and work environment. Physical exam Your health care provider may check your: Height and weight. These may be used to calculate your BMI (body mass index). BMI is a measurement that tells if you are at a healthy weight. Waist circumference. This measures the distance around your waistline. This measurement also tells if you are at a healthy weight and may help predict your risk of certain diseases, such as type 2 diabetes and high blood pressure. Heart rate and blood pressure. Body temperature.  Vaccines are usually given at various ages, according to a schedule. Your health care provider will recommend vaccines for you based on your age, medical history, and lifestyle or other factors, such as travel or where you work. What tests do I need? Screening Your health care provider may recommend screening tests for certain conditions. This may include: Lipid and cholesterol levels. Diabetes screening. This is done by checking your blood sugar (glucose) after you have not eaten for a while (fasting). Hepatitis B test. Hepatitis C test. HIV (human immunodeficiency virus) test. STI (sexually transmitted infection) testing, if you are at risk. Talk with your health care  provider about your test results, treatment options, and if necessary, the need for more tests. Follow these instructions at home: Eating and drinking  Eat a healthy diet that includes fresh fruits and vegetables, whole grains, lean protein, and low-fat dairy products. Drink enough fluid to keep your urine pale yellow. Take vitamin and mineral supplements as recommended by your health care provider. Do not drink alcohol if your health care provider tells you not to drink. If you drink alcohol: Limit how much you have to 0-2 drinks a day. Know how much alcohol is in your drink. In the [place] one drink equals one 12 oz bottle of beer (355 mL), one 5 oz glass of wine (148 mL), or one 1 oz glass of hard liquor (44 mL). Lifestyle Brush your teeth every morning and night with fluoride toothpaste. Floss one time each day. Exercise for at least 30 minutes 5 or more days each week. Do not use any products that contain nicotine or tobacco. These products include cigarettes, chewing tobacco, and vaping devices, such as e-cigarettes. If you need help quitting, ask your health care provider. Do not use drugs. If you are sexually active, practice safe sex. Use a condom or other form of protection to prevent STIs. Find healthy ways to manage stress, such as: Meditation, yoga, or listening to music. Journaling. Talking to a trusted person. Spending time with friends and family. Minimize exposure to UV radiation to reduce your risk of skin cancer. Safety Always wear your seat belt while driving or riding in a vehicle. Do not drive: If you have been drinking alcohol. Do not ride with someone who has been drinking. If you have been using any mind-altering substances or drugs. While texting. When you are tired or   distracted. Wear a helmet and other protective equipment during sports activities. If you have firearms in your house, make sure you follow all gun safety procedures. Seek help if you have been  physically or sexually abused. What's next? Go to your health care provider once a year for an annual wellness visit. Ask your health care provider how often you should have your eyes and teeth checked. Stay up to date on all vaccines. This information is not intended to replace advice given to you by your health care provider. Make sure you discuss any questions you have with your health care provider. Document Revised: 12/22/2020 Document Reviewed: 12/22/2020 Elsevier Patient Education  2023 [employer] Inc.  

## 2021-12-25 DIAGNOSIS — Z6839 Body mass index (BMI) 39.0-39.9, adult: Secondary | ICD-10-CM | POA: Insufficient documentation

## 2021-12-25 DIAGNOSIS — E785 Hyperlipidemia, unspecified: Secondary | ICD-10-CM | POA: Insufficient documentation

## 2022-01-13 ENCOUNTER — Other Ambulatory Visit: Payer: Commercial Managed Care - PPO

## 2022-01-18 ENCOUNTER — Ambulatory Visit: Payer: Commercial Managed Care - PPO | Admitting: Nurse Practitioner

## 2022-05-25 ENCOUNTER — Emergency Department
Admission: EM | Admit: 2022-05-25 | Discharge: 2022-05-25 | Disposition: A | Discharge status: Home / Self Care | Payer: Commercial Managed Care - PPO | Attending: Emergency Medicine | Admitting: Emergency Medicine

## 2022-05-25 ENCOUNTER — Emergency Department: Payer: Commercial Managed Care - PPO

## 2022-05-25 ENCOUNTER — Other Ambulatory Visit: Payer: Self-pay

## 2022-05-25 ENCOUNTER — Encounter: Payer: Self-pay | Admitting: Emergency Medicine

## 2022-05-25 DIAGNOSIS — K8020 Calculus of gallbladder without cholecystitis without obstruction: Secondary | ICD-10-CM | POA: Diagnosis not present

## 2022-05-25 DIAGNOSIS — J45909 Unspecified asthma, uncomplicated: Secondary | ICD-10-CM | POA: Diagnosis not present

## 2022-05-25 DIAGNOSIS — R079 Chest pain, unspecified: Secondary | ICD-10-CM | POA: Diagnosis present

## 2022-05-25 LAB — BASIC METABOLIC PANEL
Anion gap: 6 (ref 5–15)
BUN: 12 mg/dL (ref 6–20)
CO2: 27 mmol/L (ref 22–32)
Calcium: 8.8 mg/dL — ABNORMAL LOW (ref 8.9–10.3)
Chloride: 107 mmol/L (ref 98–111)
Creatinine, Ser: 0.8 mg/dL (ref 0.61–1.24)
GFR, Estimated: >60 mL/min (ref >60)
Glucose, Bld: 92 mg/dL (ref 70–99)
Potassium: 4 mmol/L (ref 3.5–5.1)
Sodium: 140 mmol/L (ref 135–145)

## 2022-05-25 LAB — HEPATIC FUNCTION PANEL
ALT: 19 U/L (ref 0–44)
AST: 28 U/L (ref 15–41)
Albumin: 4.1 g/dL (ref 3.5–5.0)
Alkaline Phosphatase: 53 U/L (ref 38–126)
Bilirubin, Direct: <0.1 mg/dL (ref 0.0–0.2)
Total Bilirubin: 0.5 mg/dL (ref 0.3–1.2)
Total Protein: 7.5 g/dL (ref 6.5–8.1)

## 2022-05-25 LAB — CBC
HCT: 48 % (ref 39.0–52.0)
Hemoglobin: 17 g/dL (ref 13.0–17.0)
MCH: 31.4 pg (ref 26.0–34.0)
MCHC: 35.4 g/dL (ref 30.0–36.0)
MCV: 88.7 fL (ref 80.0–100.0)
Platelets: 332 10*3/uL (ref 150–400)
RBC: 5.41 MIL/uL (ref 4.22–5.81)
RDW: 12.9 % (ref 11.5–15.5)
WBC: 7.7 10*3/uL (ref 4.0–10.5)
nRBC: 0 % (ref 0.0–0.2)

## 2022-05-25 LAB — LIPASE, BLOOD: Lipase: 30 U/L (ref 11–51)

## 2022-05-25 LAB — TROPONIN I (HIGH SENSITIVITY): Troponin I (High Sensitivity): 4 ng/L (ref <18)

## 2022-05-25 MED ORDER — HYOSCYAMINE SULFATE SL 0.125 MG SL SUBL: 0.1250 mg | SUBLINGUAL_TABLET | Freq: Four times a day (QID) | SUBLINGUAL | 1 refills | Status: DC | PRN

## 2022-05-25 NOTE — ED Provider Notes (Signed)
[facility] Provider Note    Event Date/Time   First MD Initiated Contact with Patient 05/25/22 1214     (approximate)   History   Chest Pain   HPI  ZAYDRIAN BATTA is a 36 y.o. male with history of asthma and as listed in the EMR presents to the emergency department for treatment and evaluation of central chest pain that radiates into the subscapular area on the right side that has been present for the past year but seemed worse yesterday.  Certain foods seem to make this worse.  No fever, diaphoresis or vomiting.  Does endorse occasional nausea.     Physical Exam   Triage Vital Signs: ED Triage Vitals  Enc Vitals Group     BP 05/25/22 1142 121/84     Pulse Rate 05/25/22 1142 81     Resp 05/25/22 1142 18     Temp 05/25/22 1142 98.1 F (36.7 C)     Temp Source 05/25/22 1142 Oral     SpO2 05/25/22 1142 99 %     Weight 05/25/22 1140 201 lb 1 oz (91.2 kg)     Height 05/25/22 1140 5\' 7"  (1.702 m)     Head Circumference --      Peak Flow --      Pain Score 05/25/22 1140 9     Pain Loc --      Pain Edu? --      Excl. in GC? --     Most recent vital signs: Vitals:   05/25/22 1142 05/25/22 1441  BP: 121/84 120/80  Pulse: 81 78  Resp: 18 18  Temp: 98.1 F (36.7 C)   SpO2: 99% 99%     General: Awake, no distress.  CV:  Good peripheral perfusion.  Resp:  Normal effort.  Abd:  No distention.  Other:     ED Results / Procedures / Treatments   Labs (all labs ordered are listed, but only abnormal results are displayed) Labs Reviewed  BASIC METABOLIC PANEL - Abnormal; Notable for the following components:      Result Value   Calcium 8.8 (*)    All other components within normal limits  CBC  LIPASE, BLOOD  HEPATIC FUNCTION PANEL  TROPONIN I (HIGH SENSITIVITY)     EKG  NSR with rate of 77   RADIOLOGY    PROCEDURES:  Critical Care performed: No  Procedures   MEDICATIONS ORDERED IN ED: Medications - No data to  display   IMPRESSION / MDM / ASSESSMENT AND PLAN / ED COURSE  I reviewed the triage vital signs and the nursing notes.                              Differential diagnosis includes, but is not limited to, acute cholecystitis, cholelithiasis, anxiety, cardiac event  Patient's presentation is most consistent with acute presentation with potential threat to life or bodily function.  36 year old male presenting to the emergency department for treatment and evaluation of epigastric/midsternal chest pain that radiates under the right scapula.  Symptoms have been ongoing for about a year but has been getting worse over the past 24 hours.  He is also having some nausea.  See HPI for further details.  Symptoms most consistent with cholelithiasis/acute cholecystitis.  Ultrasound ordered.  Labs are pending.   CBC, hepatic function, lipase, troponin, and BMP are all reassuring.  Ultrasound of the right upper quadrant shows  cholelithiasis without evidence of acute cholecystitis.  Results were discussed with the patient.  Plan will be to treat him with Levsin and have him follow-up with surgery for an elective cholecystectomy.  He was encouraged to return to the emergency department if he develops a fever, nausea, vomiting, or uncontrolled pain.      FINAL CLINICAL IMPRESSION(S) / ED DIAGNOSES   Final diagnoses:  Calculus of gallbladder without cholecystitis without obstruction     Rx / DC Orders   ED Discharge Orders          Ordered    Hyoscyamine Sulfate SL (LEVSIN/SL) 0.125 MG SUBL  4 times daily PRN        05/25/22 1433             Note:  This document was prepared using Dragon voice recognition software and may include unintentional dictation errors.   Chinita Pester, FNP 05/25/22 1843    Corena Herter, MD 05/28/22 Jeralyn Bennett

## 2022-05-25 NOTE — ED Triage Notes (Signed)
Pt here with CP for a year but was worse yesterday. Pt states pain is centered and radiates to his back. Pt states the pain is constant and he broke out in a sweat. Pt also having nausea.

## 2022-05-25 NOTE — Discharge Instructions (Signed)
Please call and schedule a follow up with a surgeon.  Return to the ER for symptoms that change or worsen if unable to get an appointment.

## 2022-05-26 ENCOUNTER — Telehealth: Payer: Self-pay

## 2022-05-26 NOTE — Telephone Encounter (Signed)
Transition Care Management Follow-up Telephone Call Date of discharge and from where: [place] [facility] 05/25/2022 How have you been since you were released from the hospital? okay Any questions or concerns? No  Items Reviewed: Did the pt receive and understand the discharge instructions provided? Yes  Medications obtained and verified? Yes  Other? No  Any new allergies since your discharge? Yes  Dietary orders reviewed? Yes Do you have support at home? Yes   Home Care and Equipment/Supplies: Were home health services ordered? not applicable If so, what is the name of the agency? N/a  Has the agency set up a time to come to the patient's home? no Were any new equipment or medical supplies ordered?  No What is the name of the medical supply agency? N/a Were you able to get the supplies/equipment? not applicable Do you have any questions related to the use of the equipment or supplies? No  Functional Questionnaire: (I = Independent and D = Dependent) ADLs: I  Bathing/Dressing- I  Meal Prep- I  Eating- I  Maintaining continence- I  Transferring/Ambulation- I  Managing Meds- I  Follow up appointments reviewed:  PCP Hospital f/u appt confirmed? No  , not needed Specialist Hospital f/u appt confirmed? Yes  Scheduled to see Dr Magnus Ivan on 05/30/2022 @ 9:00. Are transportation arrangements needed? No  If their condition worsens, is the pt aware to call PCP or go to the Emergency Dept.? Yes Was the patient provided with contact information for the PCP's office or ED? Yes Was to pt encouraged to call back with questions or concerns? Yes  .lh

## 2022-05-30 ENCOUNTER — Ambulatory Visit: Payer: Self-pay | Admitting: Surgery

## 2022-08-08 ENCOUNTER — Other Ambulatory Visit: Payer: Self-pay | Admitting: Nurse Practitioner

## 2022-08-08 DIAGNOSIS — J4540 Moderate persistent asthma, uncomplicated: Secondary | ICD-10-CM

## 2022-12-22 ENCOUNTER — Ambulatory Visit (INDEPENDENT_AMBULATORY_CARE_PROVIDER_SITE_OTHER): Payer: Commercial Managed Care - PPO | Admitting: Nurse Practitioner

## 2022-12-22 ENCOUNTER — Encounter: Payer: Self-pay | Admitting: Nurse Practitioner

## 2022-12-22 VITALS — BP 117/75 | HR 65 | Ht 67.0 in | Wt 203.1 lb

## 2022-12-22 DIAGNOSIS — R5383 Other fatigue: Secondary | ICD-10-CM

## 2022-12-22 DIAGNOSIS — Z0000 Encounter for general adult medical examination without abnormal findings: Secondary | ICD-10-CM

## 2022-12-22 DIAGNOSIS — Z6839 Body mass index (BMI) 39.0-39.9, adult: Secondary | ICD-10-CM

## 2022-12-22 DIAGNOSIS — J4540 Moderate persistent asthma, uncomplicated: Secondary | ICD-10-CM | POA: Diagnosis not present

## 2022-12-22 DIAGNOSIS — J3089 Other allergic rhinitis: Secondary | ICD-10-CM

## 2022-12-22 MED ORDER — FLUTICASONE PROPIONATE 50 MCG/ACT NA SUSP: 2.0000 | Freq: Every day | NASAL | 11 refills | Status: DC

## 2022-12-22 MED ORDER — FLUTICASONE-SALMETEROL 500-50 MCG/ACT IN AEPB: INHALATION_SPRAY | RESPIRATORY_TRACT | 5 refills | Status: AC

## 2022-12-22 NOTE — Progress Notes (Signed)
Complete physical exam   Patient: Scott Marquez   DOB: November 01, 1985   37 y.o. Male  MRN: [id number] Visit Date: 12/22/2022    Chief Complaint  Patient presents with   Annual Exam   Subjective    Scott Marquez is a 37 y.o. male who presents today for a complete physical exam.  He reports consuming a general diet. The patient has a physically strenuous job, but has no regular exercise apart from work.  He generally feels well. He does not have additional problems to discuss today.   HPI  Annual physical  -asthma well controlled.  -when using advair inhaler, does not need to use rescue inhaler very often.  -needs to have new prescriptions for his advair and flonase today  -states that he does drink a lot of water. States that as long as he is drinking water, he feels very good.  -due for routine, fasting labs. Would like to have these be done on different day, early in the morning.  -reports feeling fatigued and unmotivated some days which is reason for the  elevated PHQ 2/9 score today.     Row Labels 12/22/2022   10:41 AM 12/21/2021    1:11 PM 12/13/2020    1:27 PM 11/23/2020    9:16 AM 10/29/2018    2:23 PM  Depression screen PHQ 2/9   Section Header. No data exists in this row.       Decreased Interest   2 2 0 0 0  Down, Depressed, Hopeless   2 1 1 1 1   PHQ - 2 Score   4 3 1 1 1   Altered sleeping   0 0 1 0 0  Tired, decreased energy   3 2 1 3 2   Change in appetite   2 2 1 3 2   Feeling bad or failure about yourself    2 0 0 1 0  Trouble concentrating   1 1 1 1  0  Moving slowly or fidgety/restless   0 0 0 0 0  Suicidal thoughts   0 0 0 0 0  PHQ-9 Score   12 8 5 9 5   Difficult doing work/chores   Somewhat difficult Somewhat difficult   Not difficult at all     -He denies chest pain, chest pressure, or shortness of breath. He denies headaches or visual disturbances. He denies abdominal pain, nausea, vomiting, or changes in bowel or bladder habits.    Past Medical History:   Diagnosis Date   Asthma    Past Surgical History:  Procedure Laterality Date   TONSILLECTOMY     Social History   Socioeconomic History   Marital status: Married    Spouse name: Not on file   Number of children: Not on file   Years of education: Not on file   Highest education level: Not on file  Occupational History   Not on file  Tobacco Use   Smoking status: Never   Smokeless tobacco: Never  Vaping Use   Vaping Use: Never used  Substance and Sexual Activity   Alcohol use: Never   Drug use: Never   Sexual activity: Yes    Birth control/protection: None, Surgical  Other Topics Concern   Not on file  Social History Narrative   Not on file   Social Determinants of Health   Financial Resource Strain: Not on file  Food Insecurity: Not on file  Transportation Needs: Not on file  Physical Activity: Not on file  Stress: Not on file  Social Connections: Not on file  Intimate Partner Violence: Not on file   Family Status  Relation Name Status   Father  (Not Specified)   Family History  Problem Relation Age of Onset   Heart attack Father    No Known Allergies  Patient Care Team: Carlean Jews, NP as PCP - General (Family Medicine) Karie Soda, MD as Consulting Physician (General Surgery)   Medications: Outpatient Medications Prior to Visit  Medication Sig   albuterol (VENTOLIN HFA) 108 (90 Base) MCG/ACT inhaler Inhale 2 puffs into the lungs every 6 (six) hours as needed for wheezing or shortness of breath.   Hyoscyamine Sulfate SL (LEVSIN/SL) 0.125 MG SUBL Place 0.125 mg under the tongue 4 (four) times daily as needed.   [DISCONTINUED] fluticasone (FLONASE) 50 MCG/ACT nasal spray Place 2 sprays into both nostrils daily.   [DISCONTINUED] fluticasone-salmeterol (WIXELA INHUB) 500-50 MCG/ACT AEPB INHALE 1 PUFF IN THE MORNING AND AT BEDTIME   [DISCONTINUED] phentermine (ADIPEX-P) 37.5 MG tablet Take 1/2 to 1 tablet po QAM   fluticasone-salmeterol (ADVAIR)  500-50 MCG/ACT AEPB Inhale into the lungs.   No facility-administered medications prior to visit.    Review of Systems See HPI    Last CBC Lab Results  Component Value Date   WBC 7.1 01/04/2023   HGB 16.2 01/04/2023   HCT 46.2 01/04/2023   MCV 90 01/04/2023   MCH 31.6 01/04/2023   RDW 12.9 01/04/2023   PLT 348 01/04/2023   Last metabolic panel Lab Results  Component Value Date   GLUCOSE 92 01/04/2023   NA 140 01/04/2023   K 4.6 01/04/2023   CL 101 01/04/2023   CO2 25 01/04/2023   BUN 14 01/04/2023   CREATININE 0.92 01/04/2023   GFRNONAA >60 05/25/2022   CALCIUM 9.4 01/04/2023   PROT 6.9 01/04/2023   ALBUMIN 4.3 01/04/2023   LABGLOB 2.6 01/04/2023   AGRATIO 1.7 11/23/2020   BILITOT 0.4 01/04/2023   ALKPHOS 67 01/04/2023   AST 29 01/04/2023   ALT 20 01/04/2023   ANIONGAP 6 05/25/2022   Last lipids Lab Results  Component Value Date   CHOL 217 (H) 01/04/2023   HDL 58 01/04/2023   LDLCALC 149 (H) 01/04/2023   TRIG 56 01/04/2023   CHOLHDL 3.7 01/04/2023   Last hemoglobin A1c Lab Results  Component Value Date   HGBA1C 5.5 01/04/2023   Last thyroid functions Lab Results  Component Value Date   TSH 1.860 01/04/2023        Objective     Today's Vitals   12/22/22 1038  BP: 117/75  Pulse: 65  SpO2: 98%  Weight: 203 lb 1.9 oz (92.1 kg)  Height: 5\' 7"  (1.702 m)   Body mass index is 31.81 kg/m.  BP Readings from Last 3 Encounters:  12/22/22 117/75  05/25/22 120/80  12/21/21 111/69    Wt Readings from Last 3 Encounters:  12/22/22 203 lb 1.9 oz (92.1 kg)  05/25/22 201 lb 1 oz (91.2 kg)  12/21/21 201 lb (91.2 kg)     Physical Exam Vitals and nursing note reviewed.  Constitutional:      Appearance: Normal appearance. He is well-developed.  HENT:     Head: Normocephalic and atraumatic.     Nose: Nose normal.     Mouth/Throat:     Mouth: Mucous membranes are moist.     Pharynx: Oropharynx is clear.  Eyes:     Extraocular Movements:  Extraocular movements intact.  Conjunctiva/sclera: Conjunctivae normal.     Pupils: Pupils are equal, round, and reactive to light.  Neck:     Vascular: No carotid bruit.  Cardiovascular:     Rate and Rhythm: Normal rate and regular rhythm.     Pulses: Normal pulses.     Heart sounds: Normal heart sounds.  Pulmonary:     Effort: Pulmonary effort is normal.     Breath sounds: Normal breath sounds.  Abdominal:     Palpations: Abdomen is soft.  Musculoskeletal:        General: Normal range of motion.     Cervical back: Normal range of motion and neck supple.  Lymphadenopathy:     Cervical: No cervical adenopathy.  Skin:    General: Skin is warm and dry.     Capillary Refill: Capillary refill takes less than 2 seconds.  Neurological:     General: No focal deficit present.     Mental Status: He is alert and oriented to person, place, and time.  Psychiatric:        Mood and Affect: Mood normal.        Behavior: Behavior normal.        Thought Content: Thought content normal.        Judgment: Judgment normal.    Last depression screening scores   Row Labels 12/22/2022   10:41 AM 12/21/2021    1:11 PM 12/13/2020    1:27 PM  PHQ 2/9 Scores   Section Header. No data exists in this row.     PHQ - 2 Score   4 3 1   PHQ- 9 Score   12 8 5    Last fall risk screening   Row Labels 12/22/2022   10:40 AM  Fall Risk    Section Header. No data exists in this row.   Falls in the past year?   0  Number falls in past yr:   0  Injury with Fall?   0  Risk for fall due to :   No Fall Risks  Follow up   Falls evaluation completed     Assessment & Plan     Encounter for wellness examination in adult  Moderate persistent asthma without complication Assessment & Plan: Reports sx's well controlled- currently on Advair 500-50 mcg QD, Ventolin HFA PRN He has never used tobacco/vape products    Orders: -     Fluticasone-Salmeterol; INHALE 1 PUFF IN THE MORNING AND AT BEDTIME  Dispense: 60  each; Refill: 5  Perennial allergic rhinitis Assessment & Plan: Flonase nasal spray. -Use 2 sprays in both nostrils daily. -Recommend use of OTC Claritin or Zyrtec daily as well  Orders: -     Fluticasone Propionate; Place 2 sprays into both nostrils daily.  Dispense: 16 g; Refill: 11  Other fatigue Assessment & Plan: Check routine fasting labs in near future for further evaluation.   BMI 39.0-39.9,adult Assessment & Plan: Discussed lowering calorie intake to 1500 calories per day and incorporating exercise into daily routine to help lose weight.       Immunization History  Administered Date(s) Administered   DTaP 12/29/1986, 02/22/1987, 04/28/1987, 07/14/1988, 01/22/1992   Hepatitis B 04/15/1999, 05/27/1999, 11/04/1999   MMR 07/14/1988   OPV 12/29/1986, 02/22/1987, 07/14/1988, 01/22/1992    Health Maintenance  Topic Date Due   COVID-19 Vaccine (1) Never done   DTaP/Tdap/Td (6 - Tdap) 10/22/1997   Hepatitis C Screening  Never done   INFLUENZA VACCINE  02/08/2023   HIV Screening  Completed   HPV VACCINES  Aged Out    Discussed health benefits of physical activity, and encouraged him to engage in regular exercise appropriate for his age and condition.    Return in about 6 months (around 06/23/2023) for asthma - needs early morning appt in next few weeks for routien, fasting labs. Carlean Jews, NP  [facility] at [facility] [phone] (phone) [phone] (fax)  [facility]

## 2022-12-27 ENCOUNTER — Other Ambulatory Visit: Payer: Self-pay | Admitting: Nurse Practitioner

## 2022-12-27 DIAGNOSIS — Z0000 Encounter for general adult medical examination without abnormal findings: Secondary | ICD-10-CM

## 2022-12-27 DIAGNOSIS — E785 Hyperlipidemia, unspecified: Secondary | ICD-10-CM

## 2023-01-04 ENCOUNTER — Other Ambulatory Visit: Payer: Commercial Managed Care - PPO

## 2023-01-04 DIAGNOSIS — Z0000 Encounter for general adult medical examination without abnormal findings: Secondary | ICD-10-CM

## 2023-01-04 DIAGNOSIS — E785 Hyperlipidemia, unspecified: Secondary | ICD-10-CM

## 2023-01-05 LAB — CBC WITH DIFFERENTIAL/PLATELET
Basophils Absolute: 0 10*3/uL (ref 0.0–0.2)
Basos: 1 %
EOS (ABSOLUTE): 0.3 10*3/uL (ref 0.0–0.4)
Eos: 5 %
Hematocrit: 46.2 % (ref 37.5–51.0)
Hemoglobin: 16.2 g/dL (ref 13.0–17.7)
Immature Grans (Abs): 0 10*3/uL (ref 0.0–0.1)
Immature Granulocytes: 0 %
Lymphocytes Absolute: 2.2 10*3/uL (ref 0.7–3.1)
Lymphs: 31 %
MCH: 31.6 pg (ref 26.6–33.0)
MCHC: 35.1 g/dL (ref 31.5–35.7)
MCV: 90 fL (ref 79–97)
Monocytes Absolute: 0.5 10*3/uL (ref 0.1–0.9)
Monocytes: 8 %
Neutrophils Absolute: 4 10*3/uL (ref 1.4–7.0)
Neutrophils: 55 %
Platelets: 348 10*3/uL (ref 150–450)
RBC: 5.13 x10E6/uL (ref 4.14–5.80)
RDW: 12.9 % (ref 11.6–15.4)
WBC: 7.1 10*3/uL (ref 3.4–10.8)

## 2023-01-05 LAB — COMPREHENSIVE METABOLIC PANEL
ALT: 20 IU/L (ref 0–44)
AST: 29 IU/L (ref 0–40)
Albumin: 4.3 g/dL (ref 4.1–5.1)
Alkaline Phosphatase: 67 IU/L (ref 44–121)
BUN/Creatinine Ratio: 15 (ref 9–20)
BUN: 14 mg/dL (ref 6–20)
Bilirubin Total: 0.4 mg/dL (ref 0.0–1.2)
CO2: 25 mmol/L (ref 20–29)
Calcium: 9.4 mg/dL (ref 8.7–10.2)
Chloride: 101 mmol/L (ref 96–106)
Creatinine, Ser: 0.92 mg/dL (ref 0.76–1.27)
Globulin, Total: 2.6 g/dL (ref 1.5–4.5)
Glucose: 92 mg/dL (ref 70–99)
Potassium: 4.6 mmol/L (ref 3.5–5.2)
Sodium: 140 mmol/L (ref 134–144)
Total Protein: 6.9 g/dL (ref 6.0–8.5)
eGFR: 111 mL/min/{1.73_m2} (ref >59)

## 2023-01-05 LAB — LIPID PANEL
Chol/HDL Ratio: 3.7 ratio (ref 0.0–5.0)
Cholesterol, Total: 217 mg/dL — ABNORMAL HIGH (ref 100–199)
HDL: 58 mg/dL (ref >39)
LDL Chol Calc (NIH): 149 mg/dL — ABNORMAL HIGH (ref 0–99)
Triglycerides: 56 mg/dL (ref 0–149)
VLDL Cholesterol Cal: 10 mg/dL (ref 5–40)

## 2023-01-05 LAB — TSH: TSH: 1.86 u[IU]/mL (ref 0.450–4.500)

## 2023-01-05 LAB — HEMOGLOBIN A1C
Est. average glucose Bld gHb Est-mCnc: 111 mg/dL
Hgb A1c MFr Bld: 5.5 % (ref 4.8–5.6)

## 2023-01-07 DIAGNOSIS — R5383 Other fatigue: Secondary | ICD-10-CM | POA: Insufficient documentation

## 2023-01-07 NOTE — Assessment & Plan Note (Signed)
Discussed lowering calorie intake to 1500 calories per day and incorporating exercise into daily routine to help lose weight.  °

## 2023-01-07 NOTE — Assessment & Plan Note (Signed)
Reports sx's well controlled- currently on Advair 500-50 mcg QD, Ventolin HFA PRN He has never used tobacco/vape products

## 2023-01-07 NOTE — Assessment & Plan Note (Signed)
Check routine fasting labs in near future for further evaluation.

## 2023-01-07 NOTE — Assessment & Plan Note (Signed)
Flonase nasal spray. -Use 2 sprays in both nostrils daily. -Recommend use of OTC Claritin or Zyrtec daily as well

## 2023-01-08 NOTE — Progress Notes (Signed)
Please let the patient know that  labs show Mild elevation of LDL insulin cholesterol.  Patient to limit intake of fried and fatty foods.  Increase lean proteins and green leafy vegetables.  We can recheck in 6 to 12 months.  All other labs normal. ,Thanks  -HB

## 2023-06-22 ENCOUNTER — Ambulatory Visit: Payer: Commercial Managed Care - PPO | Admitting: Family Medicine

## 2023-06-22 NOTE — Progress Notes (Unsigned)
   Established Patient Office Visit  Subjective   Patient ID: LAITHEN ADIE, male    DOB: January 20, 1986  Age: 37 y.o. MRN: [id number]  No chief complaint on file.   HPI  What is it?  Physical?  Follow-up?  He did not get any labs.  He only takes inhalers.    The ASCVD Risk score (Arnett DK, et al., 2019) failed to calculate for the following reasons:   The 2019 ASCVD risk score is only valid for ages 16 to 47  Health Maintenance Due  Topic Date Due   DTaP/Tdap/Td (6 - Tdap) 10/22/1997   Hepatitis C Screening  Never done   INFLUENZA VACCINE  Never done   COVID-19 Vaccine (1 - 2024-25 season) Never done      Objective:     There were no vitals taken for this visit. {Vitals History (Optional):23777}  Physical Exam   No results found for any visits on 06/22/23.      Assessment & Plan:   There are no diagnoses linked to this encounter.   No follow-ups on file.    Sandre Kitty, MD

## 2023-08-31 ENCOUNTER — Telehealth: Payer: Self-pay | Admitting: *Deleted

## 2023-08-31 NOTE — Telephone Encounter (Signed)
Copied from CRM [id number]. Topic: Clinical - Medication Question >> Aug 31, 2023 12:28 PM Antony Haste wrote: Reason for CRM: PT's wife states he  visited Urgent Care in [place]  today for chest congestion and coughing and was given prednisone for treatment. Pt's wife Irving Burton states the urgent care center advised an order for a nebulizer and the medicine that goes with it must be called in by his PCP. Irving Burton states his pharmacy is: [facility] - [address] [place], [place] [address] Phone: [phone] Fax #: [phone]

## 2023-08-31 NOTE — Telephone Encounter (Signed)
Pt had appointment with you in December but was a no show. Please advise.

## 2023-09-01 MED ORDER — FULL KIT NEBULIZER SET MISC: 0 refills | Status: AC

## 2023-09-01 MED ORDER — BUDESONIDE 0.5 MG/2ML IN SUSP: 0.5000 mg | Freq: Two times a day (BID) | RESPIRATORY_TRACT | 1 refills | Status: AC

## 2023-09-01 NOTE — Telephone Encounter (Signed)
 I spoke with the patient and he states that when he went to the urgent care (notes not available in the EHR) he was given Pulmicort nebulizer treatment that improved his symptoms significantly.  Patient has been having worsening of his asthma over the past week that was not responsive to his daily maintenance inhaler and albuterol rescue inhaler.  Patient states he has had a nebulizer in the past but is unable to find it.  Will send in the Pulmicort and nebulizer order.  Advised him that he should not use this long-term or replace his other inhalers with this.  Advised him to schedule a follow-up appoint with with [place] to make sure his asthma exacerbation resolved.  Please call the patient to schedule a follow-up with [place] if he has not already called to schedule 1.

## 2023-09-03 ENCOUNTER — Telehealth: Payer: Self-pay

## 2023-09-03 NOTE — Telephone Encounter (Signed)
 This was handled in another phone message.

## 2023-09-03 NOTE — Telephone Encounter (Signed)
 Copied from CRM [id number]. Topic: Clinical - Prescription Issue >> Aug 31, 2023  3:56 PM Geroge Baseman wrote: Reason for CRM: Patients wife calling in about patients nebulizer once again, never got a call back. Office is now closed. Advised I would send note to the clinic for call back. [phone].

## 2023-10-09 ENCOUNTER — Telehealth: Payer: Self-pay

## 2023-10-09 NOTE — Telephone Encounter (Signed)
 ok

## 2023-10-09 NOTE — Telephone Encounter (Signed)
 Copied from CRM [id number]. Topic: Appointments - Appointment Scheduling >> Oct 09, 2023 10:32 AM Pierre Bali B wrote: Patient/patient representative is calling to schedule an appointment. He needs a AWV before the 15th of April due to insurance but I dont see anything available.   Refer to attachments for appointment information.

## 2023-12-18 ENCOUNTER — Other Ambulatory Visit: Payer: Self-pay | Admitting: *Deleted

## 2023-12-18 DIAGNOSIS — R5383 Other fatigue: Secondary | ICD-10-CM

## 2023-12-18 DIAGNOSIS — Z6839 Body mass index (BMI) 39.0-39.9, adult: Secondary | ICD-10-CM

## 2023-12-20 ENCOUNTER — Other Ambulatory Visit

## 2023-12-20 DIAGNOSIS — R5383 Other fatigue: Secondary | ICD-10-CM

## 2023-12-20 DIAGNOSIS — Z6839 Body mass index (BMI) 39.0-39.9, adult: Secondary | ICD-10-CM

## 2023-12-24 ENCOUNTER — Ambulatory Visit (INDEPENDENT_AMBULATORY_CARE_PROVIDER_SITE_OTHER): Admitting: Family Medicine

## 2023-12-24 ENCOUNTER — Encounter: Payer: Self-pay | Admitting: Family Medicine

## 2023-12-24 ENCOUNTER — Ambulatory Visit: Payer: Self-pay | Admitting: Family Medicine

## 2023-12-24 VITALS — BP 127/82 | HR 77 | Ht 67.0 in | Wt 205.4 lb

## 2023-12-24 DIAGNOSIS — S39011A Strain of muscle, fascia and tendon of abdomen, initial encounter: Secondary | ICD-10-CM

## 2023-12-24 DIAGNOSIS — Z0000 Encounter for general adult medical examination without abnormal findings: Secondary | ICD-10-CM | POA: Diagnosis not present

## 2023-12-24 DIAGNOSIS — L819 Disorder of pigmentation, unspecified: Secondary | ICD-10-CM | POA: Diagnosis not present

## 2023-12-24 DIAGNOSIS — H6690 Otitis media, unspecified, unspecified ear: Secondary | ICD-10-CM | POA: Diagnosis not present

## 2023-12-24 DIAGNOSIS — J3089 Other allergic rhinitis: Secondary | ICD-10-CM

## 2023-12-24 DIAGNOSIS — J4540 Moderate persistent asthma, uncomplicated: Secondary | ICD-10-CM

## 2023-12-24 MED ORDER — AMOXICILLIN-POT CLAVULANATE 875-125 MG PO TABS: 1.0000 | ORAL_TABLET | Freq: Two times a day (BID) | ORAL | 0 refills | Status: AC

## 2023-12-24 MED ORDER — FLUTICASONE PROPIONATE 50 MCG/ACT NA SUSP: 2.0000 | Freq: Every day | NASAL | 11 refills | Status: AC

## 2023-12-24 MED ORDER — FLUTICASONE-SALMETEROL 500-50 MCG/ACT IN AEPB: 1.0000 | INHALATION_SPRAY | Freq: Two times a day (BID) | RESPIRATORY_TRACT | 11 refills | Status: AC

## 2023-12-24 NOTE — Progress Notes (Unsigned)
   Annual physical  Subjective   Patient ID: NIC LAMPE, male    DOB: 1985-11-05  Age: 38 y.o. MRN: [id number]  No chief complaint on file.  HPI Paarth is a 38 y.o. old male here  for annual exam.   Work:*** Relationship:*** Children:*** Tobacco:*** Alcohol:*** Recreational drugs:***  Diet:*** Exercise:***  Family history of prostate or colorectal cancer:***  Advance directive:***  Other providers:***  HPI  Separate, acute concerns today: ***  Right ear - pain 3-4 days.  Gums hurt as well.    Concern about hernia - ruq and belly button.  .    Elevated ldl  Elevated hct  Various complaints.  =-     The ASCVD Risk score (Arnett DK, et al., 2019) failed to calculate for the following reasons:   The 2019 ASCVD risk score is only valid for ages 46 to 48  Health Maintenance Due  Topic Date Due   DTaP/Tdap/Td (6 - Tdap) 10/22/1997   HPV VACCINES (1 - Male 3-dose series) Never done   Hepatitis C Screening  Never done   Pneumococcal Vaccine 22-79 Years old (1 of 2 - PCV) Never done   COVID-19 Vaccine (1 - 2024-25 season) Never done      Objective:     There were no vitals taken for this visit. {Vitals History (Optional):23777}  Physical Exam   No results found for any visits on 12/24/23.      Assessment & Plan:   There are no diagnoses linked to this encounter.   No follow-ups on file.    Laneta Pintos, MD

## 2023-12-24 NOTE — Patient Instructions (Addendum)
 It was nice to see you today,  We addressed the following topics today: -I have sent in a prescription for Augmentin for you to take for 5 days.  If your ear is not better by then please let us  know - I believe your pain is an abdominal muscle strain.  I have provided some exercises for you to do at home. - After a month if your abdominal pain does not improve please let us  know I can send in a referral to sports medicine - I would like a dermatologist to look at the spot on your ear as this is a common place for skin cancers.  Someone would call you to schedule this appointment.    Have a great day,  Etha Henle, MD

## 2023-12-26 DIAGNOSIS — S39011A Strain of muscle, fascia and tendon of abdomen, initial encounter: Secondary | ICD-10-CM | POA: Insufficient documentation

## 2023-12-26 DIAGNOSIS — H6690 Otitis media, unspecified, unspecified ear: Secondary | ICD-10-CM | POA: Insufficient documentation

## 2023-12-26 DIAGNOSIS — L819 Disorder of pigmentation, unspecified: Secondary | ICD-10-CM | POA: Insufficient documentation

## 2023-12-26 NOTE — Assessment & Plan Note (Signed)
-   Pain for 3.5 weeks after heavy lifting - No evidence of hernia on examination - Pain varies in location and intensity - Follow up in 1 month if not improving - Recommended low-intensity exercises and stretching

## 2023-12-26 NOTE — Assessment & Plan Note (Signed)
-   Multiple spots noted on both ears consistent with sun damage - Will refer to dermatology for evaluation - Counseled on importance of sunscreen use - Will place photos in chart for dermatology reference

## 2023-12-26 NOTE — Assessment & Plan Note (Signed)
-   TM appears red and warm with pain - Prescribed antibiotic to be sent to [facility] pharmacy

## 2023-12-31 ENCOUNTER — Ambulatory Visit: Admitting: Dermatology

## 2023-12-31 ENCOUNTER — Encounter: Payer: Self-pay | Admitting: Dermatology

## 2023-12-31 DIAGNOSIS — W908XXA Exposure to other nonionizing radiation, initial encounter: Secondary | ICD-10-CM

## 2023-12-31 DIAGNOSIS — D2222 Melanocytic nevi of left ear and external auricular canal: Secondary | ICD-10-CM | POA: Diagnosis not present

## 2023-12-31 DIAGNOSIS — D492 Neoplasm of unspecified behavior of bone, soft tissue, and skin: Secondary | ICD-10-CM | POA: Diagnosis not present

## 2023-12-31 DIAGNOSIS — D2221 Melanocytic nevi of right ear and external auricular canal: Secondary | ICD-10-CM

## 2023-12-31 DIAGNOSIS — D229 Melanocytic nevi, unspecified: Secondary | ICD-10-CM

## 2023-12-31 DIAGNOSIS — L578 Other skin changes due to chronic exposure to nonionizing radiation: Secondary | ICD-10-CM

## 2023-12-31 DIAGNOSIS — D224 Melanocytic nevi of scalp and neck: Secondary | ICD-10-CM

## 2023-12-31 DIAGNOSIS — D2239 Melanocytic nevi of other parts of face: Secondary | ICD-10-CM

## 2023-12-31 DIAGNOSIS — D225 Melanocytic nevi of trunk: Secondary | ICD-10-CM | POA: Diagnosis not present

## 2023-12-31 NOTE — Patient Instructions (Addendum)

## 2023-12-31 NOTE — Progress Notes (Addendum)
 New Patient Visit   Subjective  Scott Marquez is a 38 y.o. male who presents for the following: place at R ear, patient reports his PCP noticed it and wanted patient to get it looked at. Patient reports he has had this his entire life. Patient denies any personal or family hx of skin cancer.   Patient also wanting place at posterior neck looked at. Patient reports his hits it when he shaves.   This patient is accompanied in the office by his spouse.   The patient has spots, moles and lesions to be evaluated, some may be new or changing and the patient may have concern these could be cancer.   The following portions of the chart were reviewed this encounter and updated as appropriate: medications, allergies, medical history  Review of Systems:  No other skin or systemic complaints except as noted in HPI or Assessment and Plan.  Objective  Well appearing patient in no apparent distress; mood and affect are within normal limits.  A focused examination was performed of the following areas: R ear, neck, face, back  Relevant exam findings are noted in the Assessment and Plan.  R neck base 4 mm Pink pigmented papules   L lower back 3 mm Pink pigmented papules    Assessment & Plan   MELANOCYTIC NEVI Exam: Tan-brown and/or pink-flesh-colored symmetric macules and papules on back scalp ears nose  Treatment Plan: Benign appearing on exam today. Recommend observation. Call clinic for new or changing moles. Recommend daily use of broad spectrum spf 30+ sunscreen to sun-exposed areas.   ACTINIC DAMAGE - chronic, secondary to cumulative UV radiation exposure/sun exposure over time - diffuse scaly erythematous macules with underlying dyspigmentation - Recommend daily broad spectrum sunscreen SPF 30+ to sun-exposed areas, reapply every 2 hours as needed.  - Recommend staying in the shade or wearing long sleeves, sun glasses (UVA+UVB protection) and wide brim hats (4-inch brim around  the entire circumference of the hat). - Call for new or changing lesions.  NEOPLASM OF SKIN (2) R neck base Skin / nail biopsy Type of biopsy: tangential   Informed consent: discussed and consent obtained   Timeout: patient name, date of birth, surgical site, and procedure verified   Procedure prep:  Patient was prepped and draped in usual sterile fashion Prep type:  Isopropyl alcohol Anesthesia: the lesion was anesthetized in a standard fashion   Anesthetic:  1% lidocaine  w/ epinephrine  1-100,000 buffered w/ 8.4% NaHCO3 Instrument used: DermaBlade   Hemostasis achieved with: pressure and aluminum chloride   Outcome: patient tolerated procedure well   Post-procedure details: sterile dressing applied and wound care instructions given   Dressing type: bandage and petrolatum   Specimen 1 - Surgical pathology Differential Diagnosis: Nevus  Check Margins: No 4 mm Pink pigmented papules L lower back Skin / nail biopsy Type of biopsy: tangential   Informed consent: discussed and consent obtained   Timeout: patient name, date of birth, surgical site, and procedure verified   Procedure prep:  Patient was prepped and draped in usual sterile fashion Prep type:  Isopropyl alcohol Anesthesia: the lesion was anesthetized in a standard fashion   Anesthetic:  1% lidocaine  w/ epinephrine  1-100,000 buffered w/ 8.4% NaHCO3 Instrument used: DermaBlade   Hemostasis achieved with: pressure and aluminum chloride   Outcome: patient tolerated procedure well   Post-procedure details: sterile dressing applied and wound care instructions given   Dressing type: bandage and petrolatum   Specimen 2 - Surgical pathology  Differential Diagnosis: Nevus  Check Margins: No 3 mm Pink pigmented papules Patient shaves areas and lesions are repeatedly cut and traumatized. Thus patient requested removal  Discussed nevi bases will likely be positive, may regrow after removal, may become raised again MULTIPLE  BENIGN NEVI   ACTINIC ELASTOSIS    Return if symptoms worsen or fail to improve, for w/ Dr. Claudene.  I, Jacquelynn V. Wilfred, CMA, am acting as scribe for Boneta Claudene, MD .   Documentation: I have reviewed the above documentation for accuracy and completeness, and I agree with the above.  Boneta Claudene, MD

## 2024-01-02 LAB — SURGICAL PATHOLOGY

## 2024-01-07 ENCOUNTER — Ambulatory Visit: Payer: Self-pay | Admitting: Dermatology

## 2024-04-24 ENCOUNTER — Encounter: Payer: Self-pay | Admitting: Family Medicine

## 2024-04-24 ENCOUNTER — Ambulatory Visit: Admitting: Physician Assistant

## 2024-04-24 ENCOUNTER — Ambulatory Visit: Admitting: Family Medicine

## 2024-04-24 VITALS — BP 126/69 | HR 73 | Ht 67.0 in | Wt 205.8 lb

## 2024-04-24 DIAGNOSIS — N5089 Other specified disorders of the male genital organs: Secondary | ICD-10-CM

## 2024-04-24 MED ORDER — CLOTRIMAZOLE-BETAMETHASONE 1-0.05 % EX CREA
1.0000 | TOPICAL_CREAM | Freq: Every day | CUTANEOUS | 0 refills | Status: AC
Start: 2024-04-24 — End: ?

## 2024-04-24 NOTE — Patient Instructions (Signed)
 It was nice to see you today,  We addressed the following topics today: - I would like you to discontinue your new antibacterial body wash and switch to a hypoallergenic, sensitive-skin body wash, such as a Dove bar or its liquid equivalent. - Wash the area with soap and water twice daily. After showering, dry the area completely and then apply a thin layer of the prescribed cream in about a one-inch strip around the affected area on the glans. - Continue this for at least two weeks. If the symptoms resolve, continue using the cream for an additional seven days after resolution before stopping. - To reduce irritation from clothing, you can apply a thin layer of Vaseline to the area, cover it with a piece of gauze, and secure the gauze with skin tape. Coconut oil is an alternative, but Vaseline is less likely to cause a reaction. - Over-the-counter topical lidocaine  cream can be used for symptom relief during the day as needed, but this will not treat the underlying issue.   Have a great day,  Rolan Slain, MD

## 2024-04-24 NOTE — Progress Notes (Signed)
   Acute Office Visit  Subjective:     Patient ID: Scott Marquez, male    DOB: 06-Jul-1986, 38 y.o.   MRN: [id number]  Chief Complaint  Patient presents with   Penis Pain    HPI Patient is in today for   Subjective - Penile irritation. For the past month, reports tenderness and redness under the glans penis. Describes the sensation as a poking or needle-like feeling, exacerbated by friction from underwear. It began approximately two months ago, possibly after a minor skin tear during intercourse that caused a burning sensation in the same area. Denies dysuria or penile discharge. Has tried an over-the-counter antifungal cream without relief. Reports recent change to a new antibacterial body wash. No recent changes in laundry detergent. - Expresses concern about a possible sexually transmitted infection related to past partners.  Medications Has previously used an over-the-counter antifungal cream (possibly Lamisil) for other issues and tried it on the current area without success.  PMH, PSH, FH, Social Hx Social Hx: Married and sexually active with wife only. Denies other current partners. Reports past partners, but not recently.  Showers nightly.  ROS Genitourinary: Pertinent positives include penile tenderness and redness. Pertinent negatives include no penile discharge and no dysuria. Dermatologic: No other rashes.   ROS      Objective:    BP 126/69   Pulse 73   Ht 5' 7 (1.702 m)   Wt 205 lb 12.8 oz (93.4 kg)   SpO2 98%   BMI 32.23 kg/m    Physical Exam General: Appears well, in no acute distress. GU: Exam of circumcised penis reveals minimal erythema on the ventral surface of the glans. Tenderness to palpation in this area. No visible skin tears, ulcers, or lesions. No urethral discharge. Foreskin is easily retractable.     Assessment & Plan:   Pain of male genitalia Assessment & Plan: Presents with a one-month history of tenderness and mild erythema on the  glans penis. Examination findings are consistent with a mild inflammatory process, possibly an irritant or contact dermatitis given the recent change in body wash, or post-traumatic inflammation from a reported minor tear. Fungal infection is less likely but possible. Concerns about STIs noted. - Prescribed combination steroid/antifungal cream. - Ordered urine testing for gonorrhea and chlamydia. - Ordered blood tests for herpes and syphilis. - Counseled on the possibility of a false negative if there is a STI. - Discussed the importance of partner notification and treatment if any tests are positive to prevent reinfection.  Orders: -     GC/Chlamydia Probe Amp -     HIV Antibody (routine testing w rflx) -     RPR  Other orders -     Clotrimazole-Betamethasone; Apply 1 Application topically daily.  Dispense: 30 g; Refill: 0     Return in about 5 weeks (around 05/29/2024) for pain f/u.  Toribio MARLA Slain, MD

## 2024-04-24 NOTE — Assessment & Plan Note (Addendum)
 Presents with a one-month history of tenderness and mild erythema on the glans penis. Examination findings are consistent with a mild inflammatory process, possibly an irritant or contact dermatitis given the recent change in body wash, or post-traumatic inflammation from a reported minor tear. Fungal infection is less likely but possible. Concerns about STIs noted. - Prescribed combination steroid/antifungal cream. - Ordered urine testing for gonorrhea and chlamydia. - Ordered blood tests for herpes and syphilis. - Counseled on the possibility of a false negative if there is a STI. - Discussed the importance of partner notification and treatment if any tests are positive to prevent reinfection.

## 2024-04-25 ENCOUNTER — Telehealth: Payer: Self-pay

## 2024-04-25 LAB — HIV ANTIBODY (ROUTINE TESTING W REFLEX): HIV Screen 4th Generation wRfx: NONREACTIVE

## 2024-04-25 LAB — RPR: RPR Ser Ql: NONREACTIVE

## 2024-04-25 NOTE — Telephone Encounter (Signed)
 Copied from CRM [id number]. Topic: Clinical - Lab/Test Results >> Apr 25, 2024 10:27 AM [name] wrote: Reason for CRM: Patient calling requesting the status of final lab results.   Patient would like a return call with lab results.   Can be reached at [phone], to discuss further.

## 2024-04-26 LAB — GC/CHLAMYDIA PROBE AMP
Chlamydia trachomatis, NAA: NEGATIVE
Neisseria Gonorrhoeae by PCR: NEGATIVE

## 2024-04-28 ENCOUNTER — Ambulatory Visit: Payer: Self-pay | Admitting: Family Medicine

## 2024-04-30 ENCOUNTER — Ambulatory Visit: Payer: Self-pay

## 2024-04-30 NOTE — Telephone Encounter (Signed)
 FYI Only or Action Required?: Action required by provider: update on patient condition and please call patient if additional lab work recommended over an appt. Sched  10/23 with [facility].  Patient was last seen in primary care on 04/24/2024 by Chandra Toribio POUR, MD.  Called Nurse Triage reporting Groin Swelling.  Symptoms began about a month ago.  Interventions attempted: Prescription medications: cream.  Symptoms are: gradually worsening.  Triage Disposition: See PCP When Office is Open (Within 3 Days)  Patient/caregiver understands and will follow disposition?: Yes Copied from CRM #[id number]. Topic: Clinical - Red Word Triage >> Apr 30, 2024 10:22 AM Gustabo D wrote: Pt has small blisters on penis doc suggested a cream. He's a week in and haven't seen improvement . Reason for Disposition  [1] Painless rash (e.g., redness, tiny bumps, sore) AND [2] present > 24 hours  Answer Assessment - Initial Assessment Questions Pt with one month of blisters around the glans of penis. Blisters should be seeing a huge improvement in a week with the cream given. No improvement, he actually is having new blisters on the right side  1. SYMPTOM: What's the main symptom you're concerned about? (e.g., blood in semen, discharge or pus from penis, itching, pain, rash, swelling)     New blisters, no improvement 2. LOCATION: Where is the blisters located?     Glans of penis right side 3. ONSET: When did blisters  start?     Over a month ago  4. PAIN: Is there any pain? If Yes, ask: How bad is it?  (Scale 1-10; or mild, moderate, severe)     When walking- it rubs on his underwear. 5. URINE: Any difficulty passing urine? If Yes, ask: When was the last time?     Denies difficulty 6. CAUSE: What do you think is causing the symptoms?     Unsure- some STI  7. OTHER SYMPTOMS: Do you have any other symptoms? (e.g., blood in urine, abdomen pain, fever)     denies  Protocols used: Penis and Scrotum  Symptoms-A-AH

## 2024-05-01 ENCOUNTER — Other Ambulatory Visit (HOSPITAL_COMMUNITY)
Admission: RE | Admit: 2024-05-01 | Discharge: 2024-05-01 | Disposition: A | Discharge status: Home / Self Care | Source: Ambulatory Visit

## 2024-05-01 ENCOUNTER — Ambulatory Visit (INDEPENDENT_AMBULATORY_CARE_PROVIDER_SITE_OTHER)

## 2024-05-01 VITALS — BP 108/71 | HR 71 | Ht 67.0 in | Wt 202.0 lb

## 2024-05-01 DIAGNOSIS — N5089 Other specified disorders of the male genital organs: Secondary | ICD-10-CM

## 2024-05-01 DIAGNOSIS — R3989 Other symptoms and signs involving the genitourinary system: Secondary | ICD-10-CM | POA: Insufficient documentation

## 2024-05-01 NOTE — Assessment & Plan Note (Signed)
 From prior:  Presents with a one-month history of tenderness and mild erythema on the glans penis. Examination findings are consistent with a mild inflammatory process, possibly an irritant or contact dermatitis given the recent change in body wash, or post-traumatic inflammation from a reported minor tear. Fungal infection is less likely but possible. Concerns about STIs noted. - Prescribed combination steroid/antifungal cream. - Ordered urine testing for gonorrhea and chlamydia. - Ordered blood tests for herpes and syphilis. - Counseled on the possibility of a false negative if there is a STI. - Discussed the importance of partner notification and treatment if any tests are positive to prevent reinfection.    Today:  Persistent rash and blisters suggest possible HSV infection. The patient reported using clotrimazole and betamethasone for a couple of days past the recommended two-week mark without much improvement in symptoms. Advised that rash today does not appear similar to HSV but will send swab to rule out as a cause. Advised to continue cream as prescribed by Dr. Chandra.  - Swab blisters for HSV testing. - If HSV positive, start Valacyclovir for 5-7 days. - Discuss results and next steps via MyChart. - Provided education on HSV transmission, management, and chronic nature. - Will notify if symptoms worsen or don't improve with cream

## 2024-05-01 NOTE — Patient Instructions (Signed)
 VISIT SUMMARY: Today, we addressed your concerns about the rash and blisters on your penis that have been present for about a month. We discussed the possibility of a herpes simplex virus (HSV) infection and the next steps for diagnosis and treatment.  YOUR PLAN: PENILE RASH AND BLISTERS: You have a rash and small blisters on your penis that have not improved significantly with the previous treatment. -We will swab the blisters to test for herpes simplex virus (HSV). -If the test is positive for HSV, we will start you on Valacyclovir for 5-7 days. -We will discuss the test results and next steps through MyChart. -You were provided with information on HSV transmission, management, and its chronic nature. - Based on my exam today, the bumps do not appear like herpes. We will get the test today to make sure. For now, continue using the cream prescribed by Dr. Chandra and let us  know if your symptoms worsen or do not improve over the next several days!  If you have any problems before your next visit feel free to message me via MyChart (minor issues or questions) or call the office, otherwise you may reach out to schedule an office visit.  Thank you! Saddie Sacks, PA-C

## 2024-05-01 NOTE — Progress Notes (Signed)
 Acute Office Visit  Subjective:     Patient ID: Scott Marquez, male    DOB: [birth year], 37 y.o.   MRN: [id number]  Chief Complaint  Patient presents with   Genital Warts    Onset:    HPI   History of Present Illness   Scott Marquez is a 38 year old male who presents with a rash and blisters on the penis.  Penile rash and blisters - Rash located under the glans penis, present for approximately one month - Described as feeling like 'a piece of fiberglass or like a needle poking it' - Two small, very minute blisters developed on the underside of the penis - No leakage or crusting of the blisters - Significant decrease in pain today, first time since starting topical therapy  Prior treatments and response - Previously evaluated by D. Chandra last week and diagnosed with a rash. STD testing was ordered and was unremarkable (GC/CZ, RPR, HIV).  - Prescribed topical clotrimazole-betamethasone cream BID, which he is currently using as directed.  - Minimal improvement in rash despite treatment - But does report that he almost canceled the appointment today because today has been the first day that the lesions do not hurt him  Sexual history and psychosocial concerns - No new sexual partners in recent years - Open communication with wife regarding symptoms - Expresses embarrassment and concern about possible sexually transmitted infection  - Would like HSV testing today to rule out herpes as a cause of the rash     ROS Per HPI     Objective:    BP 108/71   Pulse 71   Ht 5' 7 (1.702 m)   Wt 202 lb 0.6 oz (91.6 kg)   SpO2 98%   BMI 31.64 kg/m    Physical Exam Exam conducted with a chaperone present.  Constitutional:      General: He is not in acute distress. Genitourinary:    Penis: Lesions (2 pin-point/minute vesicular-like lesions on ventral surface of penis) present. No discharge or swelling.      Testes: Normal.  Neurological:     Mental Status: He is  alert.     No results found for any visits on 05/01/24.      Assessment & Plan:   Genital sore -     Cervicovaginal ancillary only  Pain of male genitalia Assessment & Plan: From prior:  Presents with a one-month history of tenderness and mild erythema on the glans penis. Examination findings are consistent with a mild inflammatory process, possibly an irritant or contact dermatitis given the recent change in body wash, or post-traumatic inflammation from a reported minor tear. Fungal infection is less likely but possible. Concerns about STIs noted. - Prescribed combination steroid/antifungal cream. - Ordered urine testing for gonorrhea and chlamydia. - Ordered blood tests for herpes and syphilis. - Counseled on the possibility of a false negative if there is a STI. - Discussed the importance of partner notification and treatment if any tests are positive to prevent reinfection.    Today:  Persistent rash and blisters suggest possible HSV infection. The patient reported using clotrimazole and betamethasone for a couple of days past the recommended two-week mark without much improvement in symptoms. Advised that rash today does not appear similar to HSV but will send swab to rule out as a cause. Advised to continue cream as prescribed by Dr. Chandra.  - Swab blisters for HSV testing. - If HSV positive, start Valacyclovir for 5-7  days. - Discuss results and next steps via MyChart. - Provided education on HSV transmission, management, and chronic nature. - Will notify if symptoms worsen or don't improve with cream             Return if symptoms worsen or fail to improve.  Saddie JULIANNA Sacks, PA-C

## 2024-05-09 ENCOUNTER — Other Ambulatory Visit: Payer: Self-pay

## 2024-05-12 NOTE — Telephone Encounter (Signed)
Contacted pt and appt made.

## 2024-05-27 ENCOUNTER — Other Ambulatory Visit: Payer: Self-pay | Admitting: Medical Genetics

## 2024-05-29 ENCOUNTER — Ambulatory Visit: Admitting: Family Medicine

## 2024-06-13 ENCOUNTER — Other Ambulatory Visit: Payer: Self-pay | Admitting: Family Medicine

## 2024-06-13 DIAGNOSIS — E785 Hyperlipidemia, unspecified: Secondary | ICD-10-CM

## 2024-06-13 DIAGNOSIS — J4540 Moderate persistent asthma, uncomplicated: Secondary | ICD-10-CM

## 2024-06-13 DIAGNOSIS — Z6831 Body mass index (BMI) 31.0-31.9, adult: Secondary | ICD-10-CM

## 2024-06-16 ENCOUNTER — Other Ambulatory Visit: Payer: Self-pay | Admitting: Family Medicine

## 2024-06-16 DIAGNOSIS — R3989 Other symptoms and signs involving the genitourinary system: Secondary | ICD-10-CM

## 2024-06-16 DIAGNOSIS — N5089 Other specified disorders of the male genital organs: Secondary | ICD-10-CM

## 2024-06-19 ENCOUNTER — Other Ambulatory Visit

## 2024-06-19 DIAGNOSIS — N5089 Other specified disorders of the male genital organs: Secondary | ICD-10-CM

## 2024-06-19 DIAGNOSIS — R3989 Other symptoms and signs involving the genitourinary system: Secondary | ICD-10-CM

## 2024-06-20 LAB — HSV 1 AND 2 AB, IGG
HSV 1 Glycoprotein G Ab, IgG: REACTIVE — AB
HSV 2 IgG, Type Spec: NONREACTIVE

## 2024-06-23 ENCOUNTER — Ambulatory Visit: Payer: Self-pay | Admitting: Family Medicine

## 2024-07-21 ENCOUNTER — Other Ambulatory Visit: Payer: Self-pay | Admitting: Medical Genetics

## 2024-07-21 DIAGNOSIS — Z006 Encounter for examination for normal comparison and control in clinical research program: Secondary | ICD-10-CM

## 2024-08-13 LAB — GENECONNECT MOLECULAR SCREEN: Genetic Analysis Overall Interpretation: NEGATIVE

## 2024-12-17 ENCOUNTER — Other Ambulatory Visit

## 2024-12-24 ENCOUNTER — Encounter: Admitting: Family Medicine
